# Patient Record
Sex: Female | Born: 1950 | Race: White | Hispanic: No | Marital: Married | Smoking: Never smoker
Health system: Southern US, Community
[De-identification: ages and names within clinical notes are randomized; demographics above are authoritative.]

## PROBLEM LIST (undated history)

## (undated) DIAGNOSIS — M2669 Other specified disorders of temporomandibular joint: Secondary | ICD-10-CM

## (undated) DIAGNOSIS — M7120 Synovial cyst of popliteal space [Baker], unspecified knee: Secondary | ICD-10-CM

## (undated) DIAGNOSIS — M810 Age-related osteoporosis without current pathological fracture: Secondary | ICD-10-CM

## (undated) DIAGNOSIS — E559 Vitamin D deficiency, unspecified: Secondary | ICD-10-CM

## (undated) DIAGNOSIS — M170 Bilateral primary osteoarthritis of knee: Secondary | ICD-10-CM

## (undated) DIAGNOSIS — R9389 Abnormal findings on diagnostic imaging of other specified body structures: Secondary | ICD-10-CM

## (undated) HISTORY — PX: TMJ ARTHROSCOPY: SHX1067

## (undated) HISTORY — DX: Vitamin D deficiency, unspecified: E55.9

## (undated) HISTORY — DX: Synovial cyst of popliteal space (Baker), unspecified knee: M71.20

## (undated) HISTORY — DX: Other specified disorders of temporomandibular joint: M26.69

## (undated) HISTORY — PX: BREAST BIOPSY: SHX20

## (undated) HISTORY — DX: Age-related osteoporosis without current pathological fracture: M81.0

## (undated) HISTORY — DX: Bilateral primary osteoarthritis of knee: M17.0

## (undated) HISTORY — DX: Abnormal findings on diagnostic imaging of other specified body structures: R93.89

## (undated) HISTORY — PX: COLONOSCOPY: SHX174

## (undated) HISTORY — PX: ABDOMINOPLASTY: SUR9

## (undated) HISTORY — PX: OTHER SURGICAL HISTORY: SHX169

---

## 1997-10-30 ENCOUNTER — Other Ambulatory Visit: Admission: RE | Admit: 1997-10-30 | Discharge: 1997-10-30 | Payer: Self-pay | Admitting: Obstetrics and Gynecology

## 1998-01-14 ENCOUNTER — Other Ambulatory Visit: Admission: RE | Admit: 1998-01-14 | Discharge: 1998-01-14 | Payer: Self-pay | Admitting: Radiology

## 1998-02-16 ENCOUNTER — Other Ambulatory Visit: Admission: RE | Admit: 1998-02-16 | Discharge: 1998-02-16 | Payer: Self-pay

## 1999-01-14 ENCOUNTER — Other Ambulatory Visit: Admission: RE | Admit: 1999-01-14 | Discharge: 1999-01-14 | Payer: Self-pay | Admitting: Obstetrics and Gynecology

## 2000-01-25 ENCOUNTER — Other Ambulatory Visit: Admission: RE | Admit: 2000-01-25 | Discharge: 2000-01-25 | Payer: Self-pay | Admitting: Obstetrics and Gynecology

## 2001-06-18 ENCOUNTER — Other Ambulatory Visit: Admission: RE | Admit: 2001-06-18 | Discharge: 2001-06-18 | Payer: Self-pay | Admitting: Obstetrics and Gynecology

## 2001-10-10 ENCOUNTER — Emergency Department (HOSPITAL_COMMUNITY): Admission: EM | Admit: 2001-10-10 | Discharge: 2001-10-10 | Payer: Self-pay | Admitting: *Deleted

## 2001-10-10 ENCOUNTER — Encounter: Payer: Self-pay | Admitting: *Deleted

## 2002-08-05 ENCOUNTER — Other Ambulatory Visit: Admission: RE | Admit: 2002-08-05 | Discharge: 2002-08-05 | Payer: Self-pay | Admitting: Obstetrics and Gynecology

## 2006-10-10 ENCOUNTER — Ambulatory Visit: Payer: Self-pay | Admitting: Sports Medicine

## 2006-10-10 DIAGNOSIS — M6750 Plica syndrome, unspecified knee: Secondary | ICD-10-CM | POA: Insufficient documentation

## 2006-10-10 DIAGNOSIS — M7120 Synovial cyst of popliteal space [Baker], unspecified knee: Secondary | ICD-10-CM

## 2006-10-10 HISTORY — DX: Synovial cyst of popliteal space (Baker), unspecified knee: M71.20

## 2006-12-01 ENCOUNTER — Ambulatory Visit: Payer: Self-pay | Admitting: Sports Medicine

## 2007-02-15 ENCOUNTER — Emergency Department (HOSPITAL_COMMUNITY): Admission: EM | Admit: 2007-02-15 | Discharge: 2007-02-15 | Payer: Self-pay | Admitting: Emergency Medicine

## 2010-10-18 ENCOUNTER — Encounter: Payer: Self-pay | Admitting: Internal Medicine

## 2010-11-15 ENCOUNTER — Encounter: Payer: Self-pay | Admitting: Internal Medicine

## 2010-11-15 ENCOUNTER — Ambulatory Visit (AMBULATORY_SURGERY_CENTER): Payer: BC Managed Care – PPO | Admitting: *Deleted

## 2010-11-15 VITALS — Ht 61.5 in | Wt 136.0 lb

## 2010-11-15 DIAGNOSIS — Z1211 Encounter for screening for malignant neoplasm of colon: Secondary | ICD-10-CM

## 2010-11-15 MED ORDER — PEG-KCL-NACL-NASULF-NA ASC-C 100 G PO SOLR: ORAL | Status: DC

## 2010-11-25 ENCOUNTER — Other Ambulatory Visit: Payer: Self-pay | Admitting: Internal Medicine

## 2010-11-26 ENCOUNTER — Ambulatory Visit (AMBULATORY_SURGERY_CENTER): Payer: BC Managed Care – PPO | Admitting: Internal Medicine

## 2010-11-26 ENCOUNTER — Encounter: Payer: Self-pay | Admitting: Internal Medicine

## 2010-11-26 VITALS — BP 121/71 | HR 76 | Temp 97.6°F | Resp 20

## 2010-11-26 DIAGNOSIS — Z1211 Encounter for screening for malignant neoplasm of colon: Secondary | ICD-10-CM

## 2010-11-26 MED ORDER — SODIUM CHLORIDE 0.9 % IV SOLN: 500.0000 mL | INTRAVENOUS | Status: DC

## 2010-11-26 NOTE — Patient Instructions (Signed)
PLEASE FOLLOW DISCHARGE INSTRUCTIONS GIVEN TODAY. RESUME CURRENT MEDICATIONS TODAY. DIVERTICULOSIS AND INTERNAL HEMORRHOIDS SEEN TODAY, WE RECOMMEND HIGH FIBER DIET. SEE HANDOUTS GIVEN. CALL us WITH ANY QUESTIONS OR CONCERNS. THANK YOU!

## 2010-11-29 ENCOUNTER — Telehealth: Payer: Self-pay

## 2010-11-29 NOTE — Telephone Encounter (Signed)
Left message

## 2010-12-03 LAB — DIFFERENTIAL
Eosinophils Absolute: 0.2
Lymphocytes Relative: 20
Lymphs Abs: 1.9
Monocytes Relative: 7
Neutro Abs: 6.9
Neutrophils Relative %: 70

## 2010-12-03 LAB — I-STAT 8, (EC8 V) (CONVERTED LAB)
Chloride: 107
HCT: 43
Hemoglobin: 14.6
Operator id: 198171
Potassium: 4.7
Sodium: 139

## 2010-12-03 LAB — POCT I-STAT CREATININE: Creatinine, Ser: 0.9

## 2010-12-03 LAB — CBC
MCV: 93.9
RBC: 4.27
WBC: 9.8

## 2013-02-28 HISTORY — PX: KNEE ARTHROSCOPY: SUR90

## 2013-04-02 ENCOUNTER — Ambulatory Visit
Admission: RE | Admit: 2013-04-02 | Discharge: 2013-04-02 | Disposition: A | Discharge status: Home / Self Care | Payer: Medicare Other | Source: Ambulatory Visit | Attending: Chiropractic Medicine | Admitting: Chiropractic Medicine

## 2013-04-02 ENCOUNTER — Other Ambulatory Visit: Payer: Self-pay | Admitting: Chiropractic Medicine

## 2013-04-02 DIAGNOSIS — R0789 Other chest pain: Secondary | ICD-10-CM

## 2013-04-02 DIAGNOSIS — M542 Cervicalgia: Secondary | ICD-10-CM

## 2013-04-02 DIAGNOSIS — M549 Dorsalgia, unspecified: Secondary | ICD-10-CM

## 2013-04-02 DIAGNOSIS — R0781 Pleurodynia: Secondary | ICD-10-CM

## 2013-04-15 ENCOUNTER — Institutional Professional Consult (permissible substitution): Payer: BC Managed Care – PPO | Admitting: Pulmonary Disease

## 2013-04-22 ENCOUNTER — Ambulatory Visit (INDEPENDENT_AMBULATORY_CARE_PROVIDER_SITE_OTHER): Payer: Medicare Other | Admitting: Pulmonary Disease

## 2013-04-22 ENCOUNTER — Encounter: Payer: Self-pay | Admitting: Pulmonary Disease

## 2013-04-22 VITALS — BP 124/76 | HR 75 | Ht 61.5 in | Wt 137.0 lb

## 2013-04-22 DIAGNOSIS — R9389 Abnormal findings on diagnostic imaging of other specified body structures: Secondary | ICD-10-CM

## 2013-04-22 DIAGNOSIS — R0789 Other chest pain: Secondary | ICD-10-CM

## 2013-04-22 DIAGNOSIS — R918 Other nonspecific abnormal finding of lung field: Secondary | ICD-10-CM

## 2013-04-22 HISTORY — DX: Abnormal findings on diagnostic imaging of other specified body structures: R93.89

## 2013-04-22 NOTE — Assessment & Plan Note (Signed)
I explained to Ms. Brooke Lee today that I think that the very mild abnormal finding on her chest x-ray today was due to scarring from the lung infection she had two years ago while traveling in [place].  Considering her lack of dyspnea or cough, it is not a worrisome finding, but I would like to see a CXR from a few years ago to compare.  If she can't produce that, then I will see her back in 6 months with a repeat CXR.  Plan: -bring CXR from 2 years ago -if she can't find that, then repeat CXR in 6 months

## 2013-04-22 NOTE — Progress Notes (Signed)
Subjective:    Patient ID: Brooke Lee, female    DOB: [birth year], 63 y.o.   MRN: [id number]  HPI  Bonita QuinLinda is here to see me because she is "having difficulty in her chest area".  She has been having pain when she takes a deep breath which is really bad when she sneezes.  She had bronchitis 2 years ago for the first time in her lie.  She was traveling in [place] and [place] for this.  She also had a UTI at the time.  She wsa treated with three different antibiotics during that time.  Since then she hasn't had much improvement in chest tightness and pain with a deep breath.  In September she was traveling and she feels like her "rib cage was a steel cage that wouldn't move".  The tightness and pain occurs with a deep breath. She feels stiffness and soreness, but can't really characterize it as sharp or dull.  This has improved with treatment with he rchiropracter over the lat few years.    She has not had a cough in the last year.  She has not been sick since then.  She has never had any lung surgeries.  As a child she had pneumonia, when she was [age] years old.   She doesn't feel short of breath with regular activities despite the pain in her chest.  She just feels   Past Medical History  Diagnosis Date  . TMJ locking      Family History  Problem Relation Age of Onset  . Heart disease Mother   . Heart disease Father      History   Social History  . Marital Status: Married    Spouse Name: N/A    Number of Children: N/A  . Years of Education: N/A   Occupational History  . Not on file.   Social History Main Topics  . Smoking status: Never Smoker   . Smokeless tobacco: Never Used  . Alcohol Use: 2.4 oz/week    4 Glasses of wine per week  . Drug Use: No  . Sexual Activity: Not on file   Other Topics Concern  . Not on file   Social History Narrative  . No narrative on file     No Known Allergies   Outpatient Prescriptions Prior to Visit  Medication Sig  Dispense Refill  . Cholecalciferol (VITAMIN D3) 5000 UNITS TABS Take 1 tablet by mouth daily.        Marland Kitchen. glucosamine-chondroitin 500-400 MG tablet Take 2 tablets by mouth daily.        . Multiple Vitamins-Minerals (MULTIVITAMIN WITH MINERALS) tablet Take 1 tablet by mouth daily.        . fish oil-omega-3 fatty acids 1000 MG capsule Take 1 g by mouth 2 (two) times daily.         No facility-administered medications prior to visit.      Review of Systems  Constitutional: Negative for fever and unexpected weight change.  HENT: Positive for sneezing. Negative for congestion, dental problem, ear pain, nosebleeds, postnasal drip, rhinorrhea, sinus pressure, sore throat and trouble swallowing.   Eyes: Negative for redness and itching.  Respiratory: Positive for cough. Negative for chest tightness, shortness of breath and wheezing.   Cardiovascular: Negative for palpitations and leg swelling.  Gastrointestinal: Negative for nausea and vomiting.  Genitourinary: Negative for dysuria.  Musculoskeletal: Negative for joint swelling.  Skin: Negative for rash.  Neurological: Negative for headaches.  Hematological:  Does not bruise/bleed easily.  Psychiatric/Behavioral: Negative for dysphoric mood. The patient is not nervous/anxious.        Objective:   Physical Exam Filed Vitals:   04/22/13 1024  BP: 124/76  Pulse: 75  Height: 5' 1.5" (1.562 m)  Weight: 137 lb (62.143 kg)  SpO2: 98%   RA  Gen: well appearing, no acute distress HEENT: NCAT, PERRL, EOMi, OP clear, neck supple without masses PULM: CTA B CV: RRR, no mgr, no JVD AB: BS+, soft, nontender, no hsm Ext: warm, no edema, no clubbing, no cyanosis Derm: no rash or skin breakdown Neuro: A&Ox4, CN II-XII intact, strength 5/5 in all 4 extremities  03/2013 CXR images reviewed by me> very mild atelectasis in the left lower lobe      Assessment & Plan:   Abnormal CXR I explained to Ms. Mosco today that I think that the very mild  abnormal finding on her chest x-ray today was due to scarring from the lung infection she had two years ago while traveling in [place].  Considering her lack of dyspnea or cough, it is not a worrisome finding, but I would like to see a CXR from a few years ago to compare.  If she can't produce that, then I will see her back in 6 months with a repeat CXR.  Plan: -bring CXR from 2 years ago -if she can't find that, then repeat CXR in 6 months  Chest tightness Given that she is improving with chiropractic care I think this is a musculoskeletal issue and not due to pulmonary parenchymal problems.  However, we will get pulmonary function testing to assess further.   Updated Medication List Outpatient Encounter Prescriptions as of 04/22/2013  Medication Sig  . Cholecalciferol (VITAMIN D3) 5000 UNITS TABS Take 1 tablet by mouth daily.    Marland Kitchen glucosamine-chondroitin 500-400 MG tablet Take 2 tablets by mouth daily.    . Multiple Vitamins-Minerals (MULTIVITAMIN WITH MINERALS) tablet Take 1 tablet by mouth daily.    . fish oil-omega-3 fatty acids 1000 MG capsule Take 1 g by mouth 2 (two) times daily.

## 2013-04-22 NOTE — Patient Instructions (Signed)
Please track down the Chest X-ray from two years ago and bring it by our office. We will see you back in 6 months with a Chest X-ray if we can't get that study If you develop shortness of breath please come back to see us

## 2013-04-22 NOTE — Assessment & Plan Note (Signed)
Given that she is improving with chiropractic care I think this is a musculoskeletal issue and not due to pulmonary parenchymal problems.  However, we will get pulmonary function testing to assess further.

## 2013-05-06 ENCOUNTER — Ambulatory Visit: Payer: Medicare Other | Admitting: Internal Medicine

## 2013-06-03 ENCOUNTER — Telehealth: Payer: Self-pay

## 2013-06-03 NOTE — Telephone Encounter (Signed)
lmtcb X1 to make pt aware of films.  Per her last visit with BQ she needs to be seen in 6 mos with a cxr at that visit.  A recall has already been ordered.

## 2013-06-03 NOTE — Telephone Encounter (Signed)
Message copied by Velvet BatheAULFIELD, Lloyde Ludlam L on Mon Jun 03, 2013  2:26 PM ------      Message from: Max FickleMCQUAID, DOUGLAS B      Created: Thu May 30, 2013 10:35 PM       A,            Please let her know that I looked at all the films from the disc she brought in.  Thank her for bringing it, but let her know that unfortunately there wasn't a real chest x-ray for me to see (just spine films and none with a complete chest image).            Thanks      B ------

## 2013-06-04 NOTE — Telephone Encounter (Signed)
Pt returned call @[facility]  office.  LMTCB X2

## 2013-06-05 NOTE — Telephone Encounter (Signed)
Pt came in to [facility] office yesterday to review results.  She had her CBC drawn yesterday.  She is aware of results and recs.  Nothing further needed at this time.

## 2013-06-14 ENCOUNTER — Telehealth: Payer: Self-pay | Admitting: Pulmonary Disease

## 2013-06-14 NOTE — Telephone Encounter (Signed)
Velvet BatheAshley L Caulfield, CMA at 06/04/2013 12:29 PM    Status: Signed       Pt returned call @[facility]  office. LMTCB X2       Velvet Batheshley L Caulfield, CMA at 06/03/2013 2:26 PM     Status: Signed        lmtcb X1 to make pt aware of films. Per her last visit with BQ she needs to be seen in 6 mos with a cxr at that visit. A recall has already been ordered.        Velvet BatheAshley L Caulfield, CMA at 06/03/2013 2:26 PM     Status: Signed        Message copied by Velvet BatheAULFIELD, ASHLEY L on Mon Jun 03, 2013 2:26 PM  ------  Message from: Max FickleMCQUAID, DOUGLAS B  Created: Thu May 30, 2013 10:35 PM  A,  Please let her know that I looked at all the films from the disc she brought in. Thank her for bringing it, but let her know that unfortunately there wasn't a real chest x-ray for me to see (just spine films and none with a complete chest image).  Thanks  B  ---------------------------------------  Spoke with pt regarding this.  She has a recall in her chart already, will need a cxr before her visit per BQ's last notes.  She is aware of this. Nothing further needed at this time.

## 2013-08-29 ENCOUNTER — Ambulatory Visit: Payer: BC Managed Care – PPO | Admitting: Internal Medicine

## 2013-10-03 ENCOUNTER — Ambulatory Visit (INDEPENDENT_AMBULATORY_CARE_PROVIDER_SITE_OTHER): Payer: Medicare Other | Admitting: Family Medicine

## 2013-10-03 ENCOUNTER — Encounter: Payer: Self-pay | Admitting: Family Medicine

## 2013-10-03 VITALS — BP 113/78 | HR 70 | Temp 99.3°F | Resp 18 | Ht 61.0 in | Wt 131.0 lb

## 2013-10-03 DIAGNOSIS — R5383 Other fatigue: Principal | ICD-10-CM

## 2013-10-03 DIAGNOSIS — Z0000 Encounter for general adult medical examination without abnormal findings: Secondary | ICD-10-CM

## 2013-10-03 DIAGNOSIS — R5381 Other malaise: Secondary | ICD-10-CM

## 2013-10-03 DIAGNOSIS — IMO0001 Reserved for inherently not codable concepts without codable children: Secondary | ICD-10-CM

## 2013-10-03 LAB — LIPID PANEL
CHOL/HDL RATIO: 4
Cholesterol: 210 mg/dL — ABNORMAL HIGH (ref 0–200)
HDL: 51.9 mg/dL (ref >39.00)
LDL CALC: 136 mg/dL — AB (ref 0–99)
NonHDL: 158.1
TRIGLYCERIDES: 112 mg/dL (ref 0.0–149.0)
VLDL: 22.4 mg/dL (ref 0.0–40.0)

## 2013-10-03 LAB — CBC WITH DIFFERENTIAL/PLATELET
BASOS PCT: 0.1 % (ref 0.0–3.0)
Basophils Absolute: 0 10*3/uL (ref 0.0–0.1)
EOS ABS: 0.2 10*3/uL (ref 0.0–0.7)
EOS PCT: 2.1 % (ref 0.0–5.0)
HCT: 40.3 % (ref 36.0–46.0)
Hemoglobin: 13.1 g/dL (ref 12.0–15.0)
LYMPHS PCT: 31 % (ref 12.0–46.0)
Lymphs Abs: 2.8 10*3/uL (ref 0.7–4.0)
MCHC: 32.6 g/dL (ref 30.0–36.0)
MCV: 90.3 fl (ref 78.0–100.0)
Monocytes Absolute: 0.6 10*3/uL (ref 0.1–1.0)
Monocytes Relative: 7.1 % (ref 3.0–12.0)
Neutro Abs: 5.4 10*3/uL (ref 1.4–7.7)
Neutrophils Relative %: 59.7 % (ref 43.0–77.0)
Platelets: 351 10*3/uL (ref 150.0–400.0)
RBC: 4.46 Mil/uL (ref 3.87–5.11)
RDW: 13.9 % (ref 11.5–15.5)
WBC: 9 10*3/uL (ref 4.0–10.5)

## 2013-10-03 LAB — COMPREHENSIVE METABOLIC PANEL
ALK PHOS: 91 U/L (ref 39–117)
ALT: 13 U/L (ref 0–35)
AST: 17 U/L (ref 0–37)
Albumin: 4.2 g/dL (ref 3.5–5.2)
BILIRUBIN TOTAL: 0.6 mg/dL (ref 0.2–1.2)
BUN: 18 mg/dL (ref 6–23)
CO2: 26 mEq/L (ref 19–32)
Calcium: 9.9 mg/dL (ref 8.4–10.5)
Chloride: 99 mEq/L (ref 96–112)
Creatinine, Ser: 1 mg/dL (ref 0.4–1.2)
GFR: 60.43 mL/min (ref >60.00)
GLUCOSE: 81 mg/dL (ref 70–99)
Potassium: 4.8 mEq/L (ref 3.5–5.1)
Sodium: 134 mEq/L — ABNORMAL LOW (ref 135–145)
Total Protein: 7.2 g/dL (ref 6.0–8.3)

## 2013-10-03 LAB — TSH: TSH: 1.52 u[IU]/mL (ref 0.35–4.50)

## 2013-10-03 LAB — CK: CK TOTAL: 48 U/L (ref 7–177)

## 2013-10-03 NOTE — Progress Notes (Signed)
Pre visit review using our clinic review tool, if applicable. No additional management support is needed unless otherwise documented below in the visit note. 

## 2013-10-03 NOTE — Progress Notes (Signed)
Office Note 10/03/2013  CC:  Chief Complaint  Patient presents with  . Establish Care  . Muscle Pain    muscle tissue is hard, neck, shoulder, and leg   HPI:  Brooke Lee is a 63 y.o. White female who is here to establish care. Patient's most recent primary MD: none Old records were not reviewed prior to or during today's visit.  Most recent Tetanus 1998. Pneumovax 11/2012. Mammogram is due and is scheduled per pt.  Formerly saw [facility] for this but wants to switch all care here.  Complains of subjective feeling of decreased muscle strength muscle aches x > 1 yr.  We didn't get into this further today: put it off for separate appt in order to focus on it appropriately.  Past Medical History  Diagnosis Date  . 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 Abnormal CXR 04/22/2013    03/2013 CXR > very mild atelectasis vs scarring left lung .  Hx of pneumonia twice as a child.  Marland Kitchen BAKER'S CYST, RIGHT KNEE 10/10/2006    Qualifier: Diagnosis of  By: Oneida Alar MD, KARL      Past Surgical History  Procedure Laterality Date  . Tmj arthroscopy  remote past  . Abdominoplasty  remote past  . Breast biopsy  multiple    benign;   . Colonoscopy  approx 2011    normal; recall 10 yrs    Family History  Problem Relation Age of Onset  . Heart disease Mother   . Hypothyroidism Mother   . Heart disease Father   . Arthritis Father   . Hyperlipidemia Father   . Diabetes Father     History   Social History  . Marital Status: Married    Spouse Name: N/A    Number of Children: N/A  . Years of Education: N/A   Occupational History  . Not on file.   Social History Main Topics  . Smoking status: Never Smoker   . Smokeless tobacco: Never Used  . Alcohol Use: 1.8 - 3.0 oz/week    3-5 Glasses of wine per week     Comment: weekly  . Drug Use: No  . Sexual Activity: Not on file   Other Topics Concern  . Not on file   Social History Narrative   Married, two children, one  grandchild.   Retired: Former Armed forces operational officer, Games developer.   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   NO smoking.   Alc: avg 1 glass wine per night.   Exercise: wt training 3X per week, walking, yoga.             MEDS: none  Allergies  Allergen Reactions  . Benadryl [Diphenhydramine Hcl]     Heart races    ROS Review of Systems  Constitutional: Negative for fever and unexpected weight change.  HENT: Negative for congestion and sore throat.   Eyes: Negative for visual disturbance.  Respiratory: Negative for cough.   Cardiovascular: Negative for chest pain.  Gastrointestinal: Negative for nausea and abdominal pain.  Genitourinary: Negative for dysuria.  Musculoskeletal: Negative for back pain and joint swelling.  Skin: Negative for rash.  Neurological: Negative for tremors and headaches.  Hematological: Negative for adenopathy.    PE; Blood pressure 113/78, pulse 70, temperature 99.3 F (37.4 C), temperature source Temporal, resp. rate 18, height 5' 1" (1.549 m), weight 131 lb (59.421 kg), SpO2 99.00%. Gen: Alert, well appearing.  Patient  is oriented to person, place, time, and situation. YQM:VHQI: no injection, icteris, swelling, or exudate.  EOMI, PERRLA. Mouth: lips without lesion/swelling.  Oral mucosa pink and moist. Oropharynx without erythema, exudate, or swelling.  Neck - No masses or thyromegaly or limitation in range of motion CV: RRR, no m/r/g.   LUNGS: CTA bilat, nonlabored resps, good aeration in all lung fields. EXT: no clubbing, cyanosis, or edema.   Pertinent labs:  none  ASSESSMENT AND PLAN:   1) New pt, establishing care, obtain old records from her GYN office.  2) Muscle pain and subjective generalized weakness.  Pt requested that screening health panel labs be done today since she is fasting, so I did CBC, CMET, TSH, and FLP. Will also do CK total and ESR given her muscle complaints. She will return in  about a week to go over her muscle complaints in detail, review labs.  An After Visit Summary was printed and given to the patient.  Return for f/u with 30 min visit to discuss muscle issues in 1 week or later.Marland Kitchen

## 2013-10-04 LAB — SEDIMENTATION RATE: Sed Rate: 22 mm/hr (ref 0–22)

## 2013-10-09 ENCOUNTER — Encounter: Payer: Self-pay | Admitting: Family Medicine

## 2013-10-09 ENCOUNTER — Ambulatory Visit (INDEPENDENT_AMBULATORY_CARE_PROVIDER_SITE_OTHER): Payer: Medicare Other | Admitting: Family Medicine

## 2013-10-09 VITALS — BP 122/79 | HR 82 | Temp 99.0°F | Resp 16 | Ht 61.0 in | Wt 131.0 lb

## 2013-10-09 DIAGNOSIS — M7580 Other shoulder lesions, unspecified shoulder: Secondary | ICD-10-CM

## 2013-10-09 DIAGNOSIS — M67919 Unspecified disorder of synovium and tendon, unspecified shoulder: Secondary | ICD-10-CM

## 2013-10-09 DIAGNOSIS — M25519 Pain in unspecified shoulder: Secondary | ICD-10-CM

## 2013-10-09 DIAGNOSIS — M719 Bursopathy, unspecified: Secondary | ICD-10-CM

## 2013-10-09 NOTE — Progress Notes (Signed)
Pre visit review using our clinic review tool, if applicable. No additional management support is needed unless otherwise documented below in the visit note. 

## 2013-10-09 NOTE — Progress Notes (Signed)
OFFICE VISIT  10/11/2013   CC:  Chief Complaint  Patient presents with  . muscle problems  . Results   HPI:    Patient is a 63 y.o. Caucasian female who presents for discussion of muscle complaints.   Started having problems about 2 yrs ago with the feeling of bilat leg weakness, trouble standing up. Once she got up she felt fine and was able to walk fine.  Over time, months/year, this weakness resolved and she feels like this is all gone now. Tops of shoulders hurt "like hell", extends up into neck diffusely, feels somewhat weak in shoulders/arms--this is the lasting, prominent muscle issue she refers to.  No tingling or numbness in arms or hands.  No weakness in UEs. Intermittent periods of difficulty taking a deep breath and some musculoskeletal-type chest pain.  Again she mentions this to be thorough in her history but says this has all resolved for some time now. Today she doesn't have any of this chest feeling.    Doing massage therapy and acupuncture for shoulders and neck.    ROS: no rash.  No joint pain or swelling.  No tingling or numbness in arms or legs.  Past Medical History  Diagnosis Date  . 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 Abnormal CXR 04/22/2013    03/2013 CXR > very mild atelectasis vs scarring left lung .  Hx of pneumonia twice as a child.  Marland Kitchen. BAKER'S CYST, RIGHT KNEE 10/10/2006    Qualifier: Diagnosis of  By: Darrick PennaFIELDS MD, KARL      Past Surgical History  Procedure Laterality Date  . Tmj arthroscopy  remote past  . Abdominoplasty  remote past  . Breast biopsy  multiple    benign;   . Colonoscopy  approx 2011    normal; recall 10 yrs   MEDS: none  Allergies  Allergen Reactions  . Benadryl [Diphenhydramine Hcl]     Heart races   ROS As per HPI  PE: Blood pressure 122/79, pulse 82, temperature 99 F (37.2 C), temperature source Temporal, resp. rate 16, height 5\' 1"  (1.549 m), weight 131 lb (59.421 kg), SpO2 97.00%. Gen: Alert, well appearing.   Patient is oriented to person, place, time, and situation. LKG:MWNUENT:Eyes: no injection, icteris, swelling, or exudate.  EOMI, PERRLA. Mouth: lips without lesion/swelling.  Oral mucosa pink and moist. Oropharynx without erythema, exudate, or swelling.  Neck: mildly diminished ROM with mild stiffness.  No tenderness. No tenderness over trapezius mm's.  She has discrete TTP over both AC joints and over both acromion hooks anterolaterally. Mild pain with abduction, particularly at the 110 deg mark, neg drop sign.  +Empty can sign.  No shoulder instability. No TTP of deltoids.  UE strength 5/5 prox and dist bilat.  Spurling's neg. UE DTRs 2+ in triceps, biceps, and brachioradialis areas bilat. No LE tenderness or weakness.   CV: RRR, no m/r/g.   LUNGS: CTA bilat, nonlabored resps, good aeration in all lung fields.   LABS:  Lab Results  Component Value Date   TSH 1.52 10/03/2013   Lab Results  Component Value Date   WBC 9.0 10/03/2013   HGB 13.1 10/03/2013   HCT 40.3 10/03/2013   MCV 90.3 10/03/2013   PLT 351.0 10/03/2013   Lab Results  Component Value Date   CREATININE 1.0 10/03/2013   BUN 18 10/03/2013   NA 134* 10/03/2013   K 4.8 10/03/2013   CL 99 10/03/2013   CO2 26 10/03/2013  Lab Results  Component Value Date   ALT 13 10/03/2013   AST 17 10/03/2013   ALKPHOS 91 10/03/2013   BILITOT 0.6 10/03/2013   Lab Results  Component Value Date   CHOL 210* 10/03/2013   Lab Results  Component Value Date   HDL 51.90 10/03/2013   Lab Results  Component Value Date   LDLCALC 136* 10/03/2013   Lab Results  Component Value Date   TRIG 112.0 10/03/2013   Lab Results  Component Value Date   CHOLHDL 4 10/03/2013   No results found for this basename: PSA   Lab Results  Component Value Date   ESRSEDRATE 22 10/03/2013   CK total on 10/03/13 = 48 (normal)   IMPRESSION AND PLAN:  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 She really doesn't have any distinct myalgia/muscle issues. Discussed treatment  options, decided to get her into physical therapy with [facility]. She already uses NSAIDs in reasonable quantities pretty regularly, so we will make no change there.  An After Visit Summary was printed and given to the patient.  FOLLOW UP: Return if symptoms worsen or fail to improve.

## 2013-10-11 ENCOUNTER — Ambulatory Visit (INDEPENDENT_AMBULATORY_CARE_PROVIDER_SITE_OTHER): Payer: Medicare Other | Admitting: Nurse Practitioner

## 2013-10-11 ENCOUNTER — Encounter: Payer: Self-pay | Admitting: Nurse Practitioner

## 2013-10-11 VITALS — BP 125/80 | HR 73 | Temp 98.4°F | Ht 61.0 in

## 2013-10-11 DIAGNOSIS — M25469 Effusion, unspecified knee: Secondary | ICD-10-CM

## 2013-10-11 DIAGNOSIS — M25461 Effusion, right knee: Secondary | ICD-10-CM

## 2013-10-11 MED ORDER — DICLOFENAC SODIUM 1 % TD GEL: TRANSDERMAL | Status: DC

## 2013-10-11 NOTE — Patient Instructions (Signed)
You have arthritis in knee, evidenced by Baker's cyst, but likely damaged soft tissue when you fell yesterday. Use crutches. Wear knee sleeve. Ice 10  Minutes 3 times daily. Apply voltaren gel or use ibuprophen. Do not use both. See orthopedist today. Appointment is at [facility] at 2 pm.  Knee Effusion The medical term for having fluid in your knee is effusion. This is often due to an internal derangement of the knee. This means something is wrong inside the knee. Some of the causes of fluid in the knee may be torn cartilage, a torn ligament, or bleeding into the joint from an injury. Your knee is likely more difficult to bend and move. This is often because there is increased pain and pressure in the joint. The time it takes for recovery from a knee effusion depends on different factors, including:   Type of injury.  Your age.  Physical and medical conditions.  Rehabilitation Strategies. How long you will be away from your normal activities will depend on what kind of knee problem you have and how much damage is present. Your knee has two types of cartilage. Articular cartilage covers the bone ends and lets your knee bend and move smoothly. Two menisci, thick pads of cartilage that form a rim inside the joint, help absorb shock and stabilize your knee. Ligaments bind the bones together and support your knee joint. Muscles move the joint, help support your knee, and take stress off the joint itself. CAUSES  Often an effusion in the knee is caused by an injury to one of the menisci. This is often a tear in the cartilage. Recovery after a meniscus injury depends on how much meniscus is damaged and whether you have damaged other knee tissue. Small tears may heal on their own with conservative treatment. Conservative means rest, limited weight bearing activity and muscle strengthening exercises. Your recovery may take up to 6 weeks.  TREATMENT  Larger tears may require surgery. Meniscus injuries may  be treated during arthroscopy. Arthroscopy is a procedure in which your surgeon uses a small telescope like instrument to look in your knee. Your caregiver can make a more accurate diagnosis (learning what is wrong) by performing an arthroscopic procedure. If your injury is on the inner margin of the meniscus, your surgeon may trim the meniscus back to a smooth rim. In other cases your surgeon will try to repair a damaged meniscus with stitches (sutures). This may make rehabilitation take longer, but may provide better long term result by helping your knee keep its shock absorption capabilities. Ligaments which are completely torn usually require surgery for repair. HOME CARE INSTRUCTIONS  Use crutches as instructed.  If a brace is applied, use as directed.  Once you are home, an ice pack applied to your swollen knee may help with discomfort and help decrease swelling.  Keep your knee raised (elevated) when you are not up and around or on crutches.  Only take over-the-counter or prescription medicines for pain, discomfort, or fever as directed by your caregiver.  Your caregivers will help with instructions for rehabilitation of your knee. This often includes strengthening exercises.  You may resume a normal diet and activities as directed. SEEK MEDICAL CARE IF:   There is increased swelling in your knee.  You notice redness, swelling, or increasing pain in your knee.  An unexplained oral temperature above 102 F (38.9 C) develops. SEEK IMMEDIATE MEDICAL CARE IF:   You develop a rash.  You have difficulty breathing.  You have any allergic reactions from medications you may have been given.  There is severe pain with any motion of the knee. MAKE SURE YOU:   Understand these instructions.  Will watch your condition.  Will get help right away if you are not doing well or get worse. Document Released: 05/07/2003 Document Revised: 05/09/2011 Document Reviewed:  07/11/2007 [facility] Patient Information 2015 [employer] [place]. This information is not intended to replace advice given to you by your health care provider. Make sure you discuss any questions you have with your health care provider.

## 2013-10-11 NOTE — Progress Notes (Signed)
Subjective:    Brooke Lee is a 63 y.o. female who presents with knee swelling involving the right knee. She is accompanied by her husband & arrives in wheelchair. She has had posterior knee swelling & pain w/ flexion & weight bearing for at least 1 week. Yesterday she was climbing stairs when knee "gave out". She did not hit the ground-was able to interrupt fall w/hand rail. Knee is painful to bear weight & "feels weak". Current symptoms include: giving out, pain located anterior below knee & medial, stiffness and swelling. Pain is aggravated by any weight bearing. Patient has had prior knee problems. Evaluation to date: none. Treatment to date: avoidance of offending activity, elastic supporter which is somewhat effective, ice, OTC analgesics which are somewhat effective and naturopathic remdy-botswelia leaf.  The following portions of the patient's history were reviewed and updated as appropriate: allergies, current medications, past medical history, past social history, past surgical history and problem list.   Review of Systems Pertinent items are noted in HPI.   Objective:    BP 125/80  Pulse 73  Temp(Src) 98.4 F (36.9 C) (Oral)  Ht 5\' 1"  (1.549 m)  SpO2 98% Right knee: positive exam findings: effusion, warmth and painful flexion No joint laxity. Mild effusion. Baker's cyst.  Left knee:  no effusion, full active range of motion, no joint line tenderness, ligamentous structures intact. and mild warmth, no effusion or pain     Assessment:  1. 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  - diclofenac sodium (VOLTAREN) 1 % GEL; Apply quarter-sized amount on front, back, & sides of knee TID  Dispense: 100 g; Refill: 1 Ice Knee sleeve Crutches-Proper fit & instructed on proper use. Ref Delbert HarnessMurphy Wainer. Pt has appt. At 2 pm today.

## 2013-10-11 NOTE — Progress Notes (Signed)
Pre visit review using our clinic review tool, if applicable. No additional management support is needed unless otherwise documented below in the visit note. 

## 2013-12-03 ENCOUNTER — Ambulatory Visit: Payer: Medicare Other | Admitting: Internal Medicine

## 2014-03-04 DIAGNOSIS — S83241A Other tear of medial meniscus, current injury, right knee, initial encounter: Secondary | ICD-10-CM | POA: Diagnosis not present

## 2014-03-06 ENCOUNTER — Ambulatory Visit: Payer: Medicare Other | Admitting: Internal Medicine

## 2014-03-06 DIAGNOSIS — S83241A Other tear of medial meniscus, current injury, right knee, initial encounter: Secondary | ICD-10-CM | POA: Diagnosis not present

## 2014-03-08 DIAGNOSIS — S83241A Other tear of medial meniscus, current injury, right knee, initial encounter: Secondary | ICD-10-CM | POA: Diagnosis not present

## 2014-03-11 ENCOUNTER — Telehealth: Payer: Self-pay | Admitting: Family Medicine

## 2014-03-11 ENCOUNTER — Other Ambulatory Visit: Payer: Self-pay | Admitting: Orthopedic Surgery

## 2014-03-11 DIAGNOSIS — M7121 Synovial cyst of popliteal space [Baker], right knee: Secondary | ICD-10-CM

## 2014-03-11 DIAGNOSIS — M1711 Unilateral primary osteoarthritis, right knee: Secondary | ICD-10-CM | POA: Diagnosis not present

## 2014-03-11 DIAGNOSIS — M25561 Pain in right knee: Secondary | ICD-10-CM | POA: Diagnosis not present

## 2014-03-11 DIAGNOSIS — S83241A Other tear of medial meniscus, current injury, right knee, initial encounter: Secondary | ICD-10-CM | POA: Diagnosis not present

## 2014-03-11 DIAGNOSIS — M25461 Effusion, right knee: Secondary | ICD-10-CM | POA: Diagnosis not present

## 2014-03-11 NOTE — Telephone Encounter (Signed)
Please advise 

## 2014-03-11 NOTE — Telephone Encounter (Signed)
Pt needs 15 min appt to go over this with me please.-thx

## 2014-03-11 NOTE — Telephone Encounter (Signed)
Pt called and stated she was going to [place] Jan 22. She is requesting a RX for antibiotics in the event she needs them. She also would like to know if there are any other meds she should take with her. Please call and advise. Dwaine Deter/DH

## 2014-03-12 ENCOUNTER — Ambulatory Visit (INDEPENDENT_AMBULATORY_CARE_PROVIDER_SITE_OTHER): Payer: Commercial Managed Care - HMO | Admitting: Family Medicine

## 2014-03-12 ENCOUNTER — Ambulatory Visit
Admission: RE | Admit: 2014-03-12 | Discharge: 2014-03-12 | Disposition: A | Discharge status: Home / Self Care | Payer: Self-pay | Source: Ambulatory Visit | Attending: Orthopedic Surgery | Admitting: Orthopedic Surgery

## 2014-03-12 DIAGNOSIS — M7121 Synovial cyst of popliteal space [Baker], right knee: Secondary | ICD-10-CM | POA: Diagnosis not present

## 2014-03-12 DIAGNOSIS — Z23 Encounter for immunization: Secondary | ICD-10-CM

## 2014-03-12 MED ORDER — CIPROFLOXACIN HCL 500 MG PO TABS: ORAL_TABLET | ORAL | Status: DC

## 2014-03-12 MED ORDER — TYPHOID VACCINE PO CPDR: DELAYED_RELEASE_CAPSULE | ORAL | Status: DC

## 2014-03-12 MED ORDER — ATOVAQUONE-PROGUANIL HCL 250-100 MG PO TABS: ORAL_TABLET | ORAL | Status: DC

## 2014-03-12 NOTE — Telephone Encounter (Signed)
Pt will be coming in for nurse visit for Tdap.

## 2014-03-14 NOTE — Telephone Encounter (Signed)
Pt had Tdap 03-12-2014

## 2014-03-18 ENCOUNTER — Inpatient Hospital Stay: Admission: RE | Admit: 2014-03-18 | Payer: Self-pay | Source: Ambulatory Visit

## 2014-04-22 DIAGNOSIS — H5210 Myopia, unspecified eye: Secondary | ICD-10-CM | POA: Diagnosis not present

## 2014-04-22 DIAGNOSIS — H5203 Hypermetropia, bilateral: Secondary | ICD-10-CM | POA: Diagnosis not present

## 2014-05-06 IMAGING — CR DG CERVICAL SPINE COMPLETE 4+V
5 series · 5 of 5 positions shown · non-contrast
Comparison: None.

CLINICAL DATA: Neck pain

EXAM:
CERVICAL SPINE  4+ VIEWS

[view not recorded (1 of 5)]
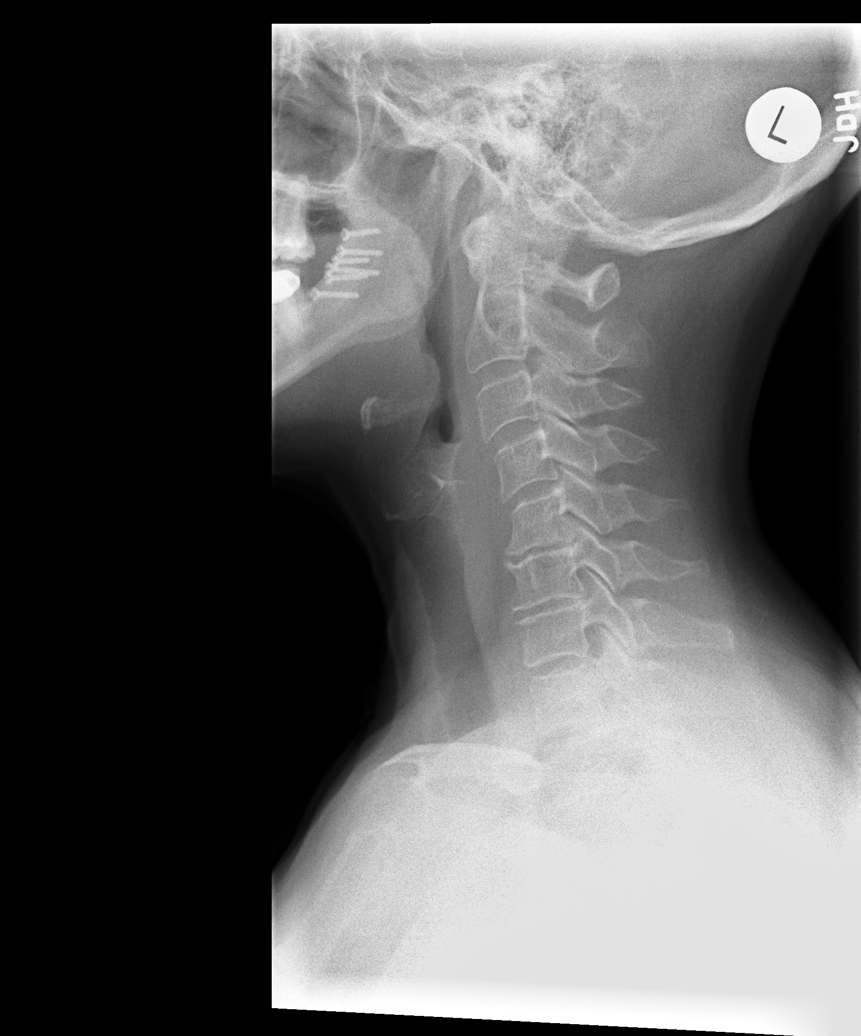

[view not recorded (2 of 5)]
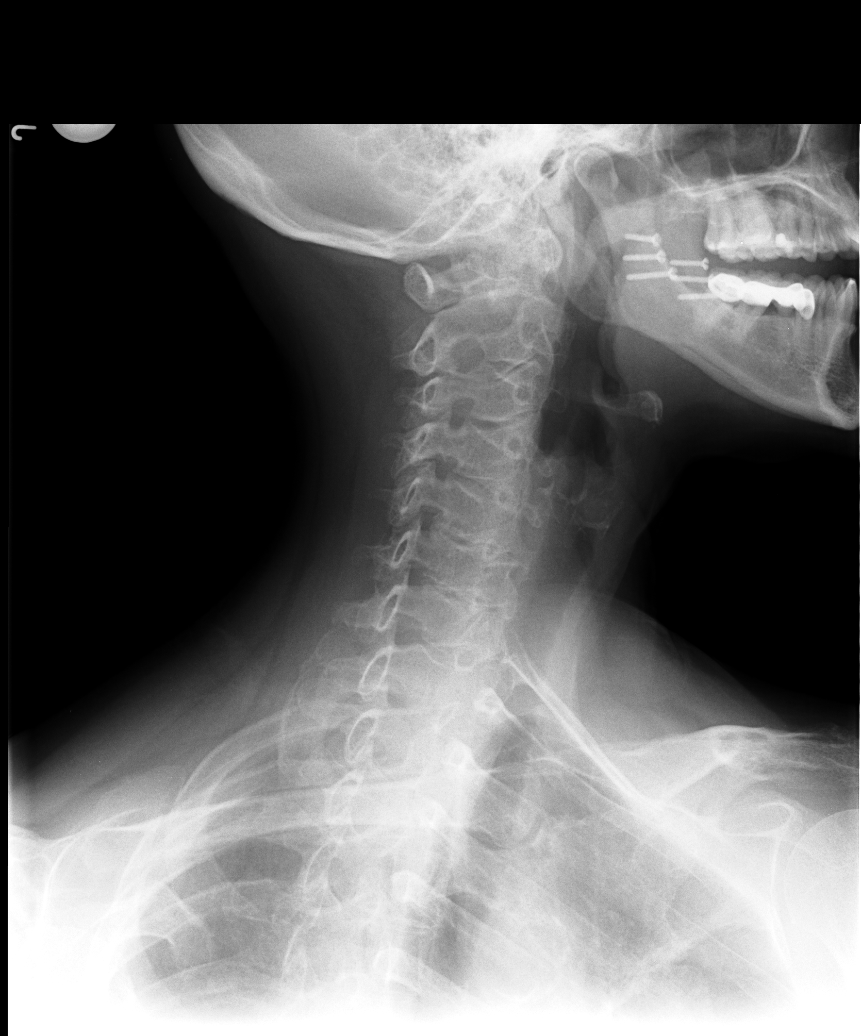

[view not recorded (3 of 5)]
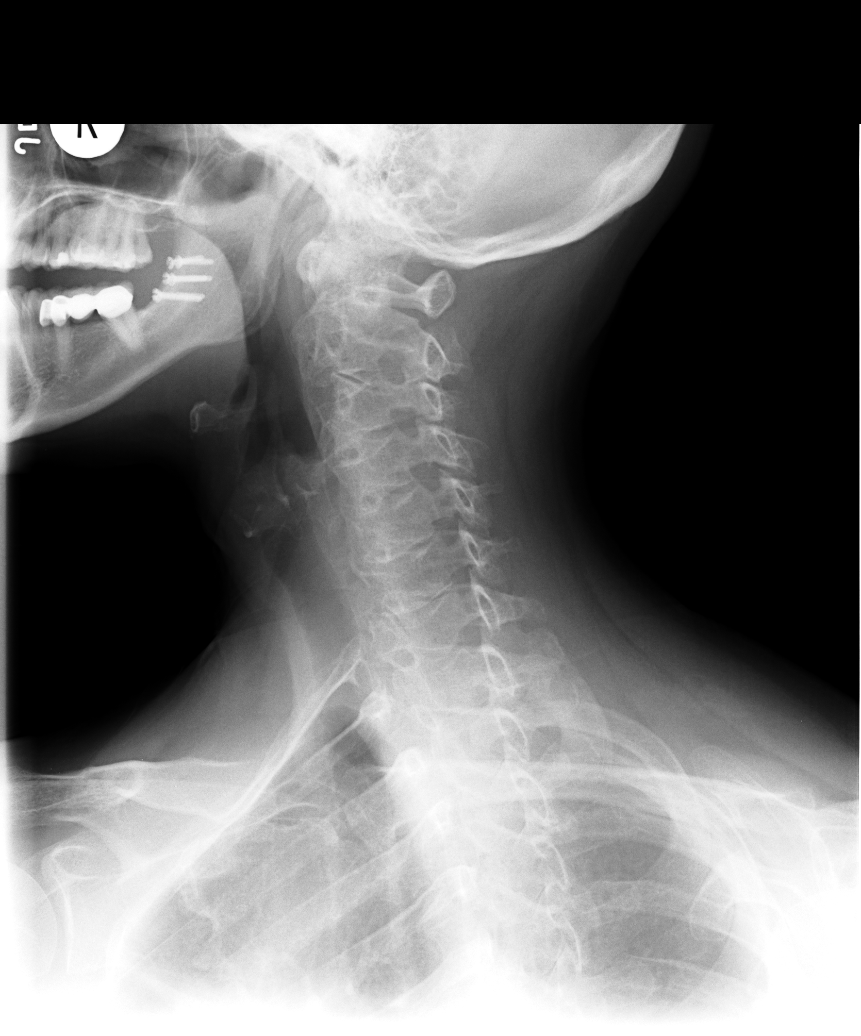

[view not recorded (4 of 5)]
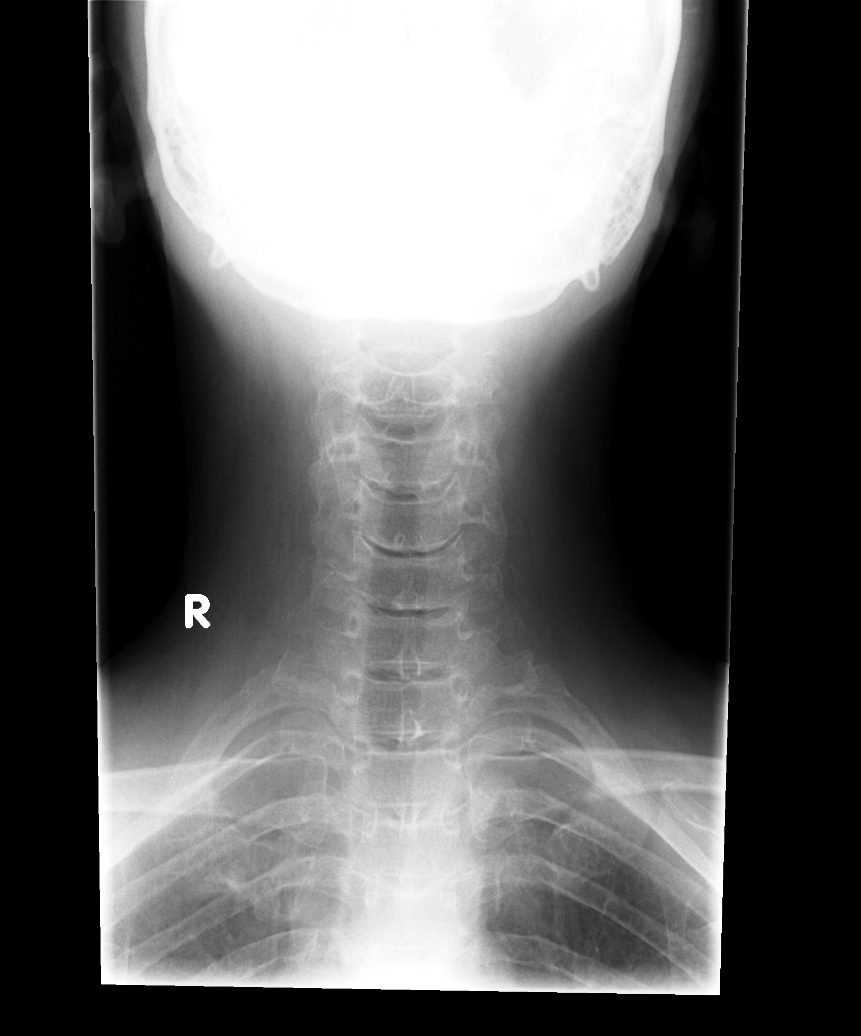

[view not recorded (5 of 5)]
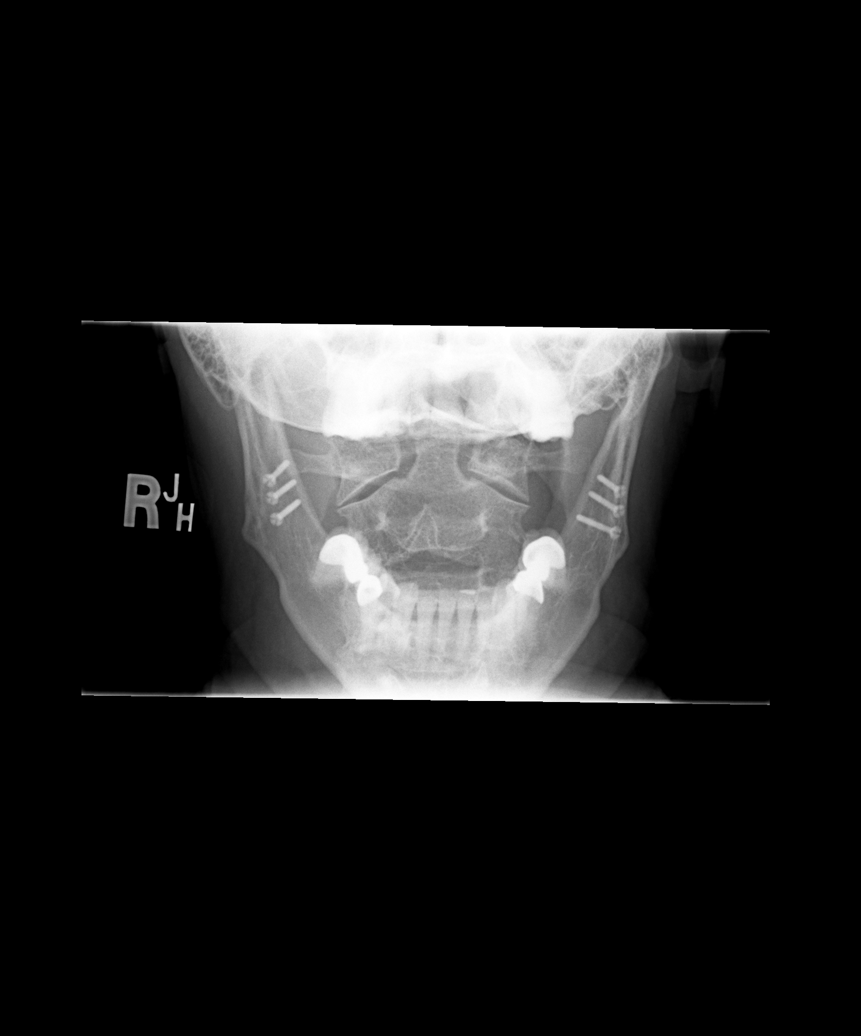

[5 of 5 positions shown; findings below may reference images not displayed]

FINDINGS: Mild cervical kyphosis at C5-6. Moderate disc degeneration and
spondylosis at C5-6 and C6-7 causing mild foraminal narrowing on the
right. Negative for fracture or mass. Prior mandible surgery with
screws bilaterally.
IMPRESSION: Cervical spondylosis at C5-6 and C6-7.

## 2014-05-08 DIAGNOSIS — S83241A Other tear of medial meniscus, current injury, right knee, initial encounter: Secondary | ICD-10-CM | POA: Diagnosis not present

## 2014-05-08 DIAGNOSIS — S83242A Other tear of medial meniscus, current injury, left knee, initial encounter: Secondary | ICD-10-CM | POA: Diagnosis not present

## 2014-06-26 DIAGNOSIS — L565 Disseminated superficial actinic porokeratosis (DSAP): Secondary | ICD-10-CM | POA: Diagnosis not present

## 2014-06-26 DIAGNOSIS — L821 Other seborrheic keratosis: Secondary | ICD-10-CM | POA: Diagnosis not present

## 2014-06-26 DIAGNOSIS — Z1231 Encounter for screening mammogram for malignant neoplasm of breast: Secondary | ICD-10-CM | POA: Diagnosis not present

## 2014-07-01 DIAGNOSIS — S83241D Other tear of medial meniscus, current injury, right knee, subsequent encounter: Secondary | ICD-10-CM | POA: Diagnosis not present

## 2014-07-01 DIAGNOSIS — S83242D Other tear of medial meniscus, current injury, left knee, subsequent encounter: Secondary | ICD-10-CM | POA: Diagnosis not present

## 2014-07-14 ENCOUNTER — Telehealth: Payer: Self-pay | Admitting: Family Medicine

## 2014-07-14 NOTE — Telephone Encounter (Signed)
Documented that pt had mammogram but no records.  LM for pt to CB and tell us where she had recent mammogram.

## 2014-07-31 ENCOUNTER — Encounter: Payer: Self-pay | Admitting: Family Medicine

## 2014-08-25 DIAGNOSIS — M170 Bilateral primary osteoarthritis of knee: Secondary | ICD-10-CM | POA: Diagnosis not present

## 2014-08-28 DIAGNOSIS — M170 Bilateral primary osteoarthritis of knee: Secondary | ICD-10-CM | POA: Diagnosis not present

## 2014-08-28 DIAGNOSIS — M1712 Unilateral primary osteoarthritis, left knee: Secondary | ICD-10-CM | POA: Diagnosis not present

## 2014-09-04 DIAGNOSIS — M1711 Unilateral primary osteoarthritis, right knee: Secondary | ICD-10-CM | POA: Diagnosis not present

## 2014-09-11 DIAGNOSIS — M1711 Unilateral primary osteoarthritis, right knee: Secondary | ICD-10-CM | POA: Diagnosis not present

## 2014-11-11 DIAGNOSIS — M25562 Pain in left knee: Secondary | ICD-10-CM | POA: Diagnosis not present

## 2014-11-13 DIAGNOSIS — M25462 Effusion, left knee: Secondary | ICD-10-CM | POA: Diagnosis not present

## 2014-11-13 DIAGNOSIS — M25562 Pain in left knee: Secondary | ICD-10-CM | POA: Diagnosis not present

## 2014-12-23 ENCOUNTER — Encounter: Payer: Self-pay | Admitting: Family Medicine

## 2014-12-23 ENCOUNTER — Ambulatory Visit (INDEPENDENT_AMBULATORY_CARE_PROVIDER_SITE_OTHER): Payer: Commercial Managed Care - HMO | Admitting: Family Medicine

## 2014-12-23 VITALS — BP 108/72 | HR 76 | Temp 98.3°F | Resp 16 | Ht 61.0 in | Wt 119.0 lb

## 2014-12-23 DIAGNOSIS — Z23 Encounter for immunization: Secondary | ICD-10-CM

## 2014-12-23 DIAGNOSIS — E2839 Other primary ovarian failure: Secondary | ICD-10-CM

## 2014-12-23 DIAGNOSIS — Z0000 Encounter for general adult medical examination without abnormal findings: Secondary | ICD-10-CM

## 2014-12-23 LAB — CBC WITH DIFFERENTIAL/PLATELET
BASOS ABS: 0.1 10*3/uL (ref 0.0–0.1)
BASOS PCT: 0.7 % (ref 0.0–3.0)
EOS ABS: 0.2 10*3/uL (ref 0.0–0.7)
Eosinophils Relative: 2 % (ref 0.0–5.0)
HCT: 36.5 % (ref 36.0–46.0)
HEMOGLOBIN: 12.1 g/dL (ref 12.0–15.0)
LYMPHS PCT: 24.1 % (ref 12.0–46.0)
Lymphs Abs: 2 10*3/uL (ref 0.7–4.0)
MCHC: 33 g/dL (ref 30.0–36.0)
MCV: 91.1 fl (ref 78.0–100.0)
MONOS PCT: 8 % (ref 3.0–12.0)
Monocytes Absolute: 0.7 10*3/uL (ref 0.1–1.0)
Neutro Abs: 5.4 10*3/uL (ref 1.4–7.7)
Neutrophils Relative %: 65.2 % (ref 43.0–77.0)
Platelets: 382 10*3/uL (ref 150.0–400.0)
RBC: 4.01 Mil/uL (ref 3.87–5.11)
RDW: 13.6 % (ref 11.5–15.5)
WBC: 8.3 10*3/uL (ref 4.0–10.5)

## 2014-12-23 LAB — COMPREHENSIVE METABOLIC PANEL
ALBUMIN: 3.9 g/dL (ref 3.5–5.2)
ALK PHOS: 95 U/L (ref 39–117)
ALT: 11 U/L (ref 0–35)
AST: 14 U/L (ref 0–37)
BUN: 13 mg/dL (ref 6–23)
CALCIUM: 9.7 mg/dL (ref 8.4–10.5)
CO2: 30 mEq/L (ref 19–32)
CREATININE: 0.66 mg/dL (ref 0.40–1.20)
Chloride: 104 mEq/L (ref 96–112)
GFR: 95.01 mL/min (ref >60.00)
Glucose, Bld: 84 mg/dL (ref 70–99)
Potassium: 5.1 mEq/L (ref 3.5–5.1)
Sodium: 141 mEq/L (ref 135–145)
Total Bilirubin: 0.6 mg/dL (ref 0.2–1.2)
Total Protein: 6.8 g/dL (ref 6.0–8.3)

## 2014-12-23 LAB — LIPID PANEL
Cholesterol: 188 mg/dL (ref 0–200)
HDL: 46.6 mg/dL (ref >39.00)
LDL Cholesterol: 121 mg/dL — ABNORMAL HIGH (ref 0–99)
NonHDL: 141.07
Total CHOL/HDL Ratio: 4
Triglycerides: 98 mg/dL (ref 0.0–149.0)
VLDL: 19.6 mg/dL (ref 0.0–40.0)

## 2014-12-23 LAB — TSH: TSH: 1.52 u[IU]/mL (ref 0.35–4.50)

## 2014-12-23 NOTE — Progress Notes (Signed)
Office Note 12/23/2014  CC:  Chief Complaint  Patient presents with  . Annual Exam    Pt is fasting.    HPI:  Brooke Lee is a 64 y.o. White female who is here for annual health maintenance exam. No acute complaints. Exercising some, eating healthy diet. Eye and dental exams UTD.   Past Medical History  Diagnosis Date  . 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 Abnormal CXR 04/22/2013    03/2013 CXR > very mild atelectasis vs scarring left lung .  Hx of pneumonia twice as a child.  Marland Kitchen. BAKER'S CYST, RIGHT KNEE 10/10/2006    Qualifier: Diagnosis of  By: Darrick PennaFIELDS MD, KARL    . Osteoarthritis of both knees     Past Surgical History  Procedure Laterality Date  . Tmj arthroscopy  remote past  . Abdominoplasty  remote past  . Breast biopsy  multiple    benign;   . Colonoscopy  approx 2011    normal; recall 10 yrs  . Knee arthroscopy  2015    Family History  Problem Relation Age of Onset  . Heart disease Mother   . Hypothyroidism Mother   . Heart disease Father   . Arthritis Father   . Hyperlipidemia Father   . Diabetes Father     Social History   Social History  . Marital Status: Married    Spouse Name: N/A  . Number of Children: N/A  . Years of Education: N/A   Occupational History  . Not on file.   Social History Main Topics  . Smoking status: Never Smoker   . Smokeless tobacco: Never Used  . Alcohol Use: 1.8 - 3.0 oz/week    3-5 Glasses of wine per week     Comment: weekly  . Drug Use: No  . Sexual Activity: Not on file   Other Topics Concern  . Not on file   Social History Narrative   Married, two children, one grandchild.   Retired: Former Psychologist, sport and exercisebusiness owner, Conservation officer, historic buildingscopiers.   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   NO smoking.   Alc: avg 1 glass wine per night.   Exercise: wt training 3X per week, walking, yoga.            MEDS: diclofenac 75mg  bid prn  Allergies  Allergen Reactions  . Benadryl  [Diphenhydramine Hcl]     Heart races    ROS Review of Systems  Constitutional: Negative for fever, chills, appetite change and fatigue.  HENT: Negative for congestion, dental problem, ear pain and sore throat.   Eyes: Negative for discharge, redness and visual disturbance.  Respiratory: Negative for cough, chest tightness, shortness of breath and wheezing.   Cardiovascular: Negative for chest pain, palpitations and leg swelling.  Gastrointestinal: Negative for nausea, vomiting, abdominal pain, diarrhea and blood in stool.  Genitourinary: Negative for dysuria, urgency, frequency, hematuria, flank pain and difficulty urinating.  Musculoskeletal: Positive for arthralgias (mild bilat knee pain,chronic). Negative for myalgias, back pain, joint swelling and neck stiffness.  Skin: Negative for pallor and rash.  Neurological: Negative for dizziness, speech difficulty, weakness and headaches.  Hematological: Negative for adenopathy. Does not bruise/bleed easily.  Psychiatric/Behavioral: Negative for confusion and sleep disturbance. The patient is not nervous/anxious.     PE; Blood pressure 108/72, pulse 76, temperature 98.3 F (36.8 C), temperature source Oral, resp. rate 16, height 5\' 1"  (1.549 m), weight 119 lb (53.978 kg), SpO2 99 %.  Gen: Alert, well appearing.  Patient is oriented to person, place, time, and situation. AFFECT: pleasant, lucid thought and speech. ENT: Ears: EACs clear, normal epithelium.  TMs with good light reflex and landmarks bilaterally.  Eyes: no injection, icteris, swelling, or exudate.  EOMI, PERRLA. Nose: no drainage or turbinate edema/swelling.  No injection or focal lesion.  Mouth: lips without lesion/swelling.  Oral mucosa pink and moist.  Dentition intact and without obvious caries or gingival swelling.  Oropharynx without erythema, exudate, or swelling.  Neck: supple/nontender.  No LAD, mass, or TM.  Carotid pulses 2+ bilaterally, without bruits. CV: RRR, no  m/r/g.   LUNGS: CTA bilat, nonlabored resps, good aeration in all lung fields. ABD: soft, NT, ND, BS normal.  No hepatospenomegaly or mass.  No bruits. EXT: no clubbing, cyanosis, or edema.  Musculoskeletal: no joint swelling, erythema, warmth, or tenderness.  ROM of all joints intact. Skin - no sores or suspicious lesions or rashes or color changes   Pertinent labs:  Lab Results  Component Value Date   TSH 1.52 10/03/2013   Lab Results  Component Value Date   WBC 9.0 10/03/2013   HGB 13.1 10/03/2013   HCT 40.3 10/03/2013   MCV 90.3 10/03/2013   PLT 351.0 10/03/2013   Lab Results  Component Value Date   CREATININE 1.0 10/03/2013   BUN 18 10/03/2013   NA 134* 10/03/2013   K 4.8 10/03/2013   CL 99 10/03/2013   CO2 26 10/03/2013   Lab Results  Component Value Date   ALT 13 10/03/2013   AST 17 10/03/2013   ALKPHOS 91 10/03/2013   BILITOT 0.6 10/03/2013   Lab Results  Component Value Date   CHOL 210* 10/03/2013   Lab Results  Component Value Date   HDL 51.90 10/03/2013   Lab Results  Component Value Date   LDLCALC 136* 10/03/2013   Lab Results  Component Value Date   TRIG 112.0 10/03/2013   Lab Results  Component Value Date   CHOLHDL 4 10/03/2013   ASSESSMENT AND PLAN:   Health maintenance exam: Reviewed age and gender appropriate health maintenance issues (prudent diet, regular exercise, health risks of tobacco and excessive alcohol, use of seatbelts, fire alarms in home, use of sunscreen).  Also reviewed age and gender appropriate health screening as well as vaccine recommendations. Flu vaccine and prevnar 13 today. Discussed vit D and Calcium recommendations for prevention of osteoporosis. DEXA scan ordered today. Fasting health panel labs ordered today. She will schedule visit with her GYN ASAP for routine f/u.  An After Visit Summary was printed and given to the patient.   FOLLOW UP:  Return in about 1 year (around 12/23/2015) for annual CPE  (fasting).

## 2014-12-23 NOTE — Progress Notes (Signed)
Pre visit review using our clinic review tool, if applicable. No additional management support is needed unless otherwise documented below in the visit note. 

## 2014-12-30 DIAGNOSIS — M810 Age-related osteoporosis without current pathological fracture: Secondary | ICD-10-CM

## 2014-12-30 HISTORY — DX: Age-related osteoporosis without current pathological fracture: M81.0

## 2015-01-26 DIAGNOSIS — M810 Age-related osteoporosis without current pathological fracture: Secondary | ICD-10-CM | POA: Diagnosis not present

## 2015-01-26 DIAGNOSIS — M85851 Other specified disorders of bone density and structure, right thigh: Secondary | ICD-10-CM | POA: Diagnosis not present

## 2015-02-04 ENCOUNTER — Encounter: Payer: Self-pay | Admitting: Family Medicine

## 2015-02-04 ENCOUNTER — Telehealth: Payer: Self-pay | Admitting: Family Medicine

## 2015-02-04 NOTE — Telephone Encounter (Signed)
Pls call pt and notify her that her bone density testing showed she has osteoporosis. I recommend she continue vit D and calcium, plus I recommend she start an additional medication to help slow down the thinning of her bones.  If pt agreeable, pls eRx fosamax generic 70 mg tabs, 1 tab po q week, #4, RF x 12.  Tell her to take this med first thing in the morning on empty stomach with full glass of water and remain sitting upright for 30 min after taking it. We'll repeat her bone density test in 2 yrs.-thx

## 2015-02-05 NOTE — Telephone Encounter (Signed)
Pt advised and voiced understanding.  She stated that she would like a copy of her BMD and a copy of her most recent labs mailed to her along with the instructions below about taking the fosamax. She stated that she wants to look into the medication and see if this is something she would want to take.  I confirmed pts address. Copies of BMD, labs and message below printed and mailed to pt.

## 2015-02-05 NOTE — Telephone Encounter (Signed)
OK 

## 2015-02-10 ENCOUNTER — Encounter: Payer: Self-pay | Admitting: Family Medicine

## 2015-02-26 DIAGNOSIS — M25562 Pain in left knee: Secondary | ICD-10-CM | POA: Diagnosis not present

## 2015-03-10 DIAGNOSIS — H5210 Myopia, unspecified eye: Secondary | ICD-10-CM | POA: Diagnosis not present

## 2015-03-10 DIAGNOSIS — H5203 Hypermetropia, bilateral: Secondary | ICD-10-CM | POA: Diagnosis not present

## 2015-06-29 DIAGNOSIS — M25561 Pain in right knee: Secondary | ICD-10-CM | POA: Diagnosis not present

## 2015-06-29 DIAGNOSIS — M170 Bilateral primary osteoarthritis of knee: Secondary | ICD-10-CM | POA: Diagnosis not present

## 2015-06-29 DIAGNOSIS — R262 Difficulty in walking, not elsewhere classified: Secondary | ICD-10-CM | POA: Diagnosis not present

## 2015-06-29 DIAGNOSIS — M25562 Pain in left knee: Secondary | ICD-10-CM | POA: Diagnosis not present

## 2015-07-06 DIAGNOSIS — M25561 Pain in right knee: Secondary | ICD-10-CM | POA: Diagnosis not present

## 2015-07-06 DIAGNOSIS — M170 Bilateral primary osteoarthritis of knee: Secondary | ICD-10-CM | POA: Diagnosis not present

## 2015-07-06 DIAGNOSIS — M25562 Pain in left knee: Secondary | ICD-10-CM | POA: Diagnosis not present

## 2015-07-09 DIAGNOSIS — M25561 Pain in right knee: Secondary | ICD-10-CM | POA: Diagnosis not present

## 2015-07-09 DIAGNOSIS — M170 Bilateral primary osteoarthritis of knee: Secondary | ICD-10-CM | POA: Diagnosis not present

## 2015-07-09 DIAGNOSIS — M25562 Pain in left knee: Secondary | ICD-10-CM | POA: Diagnosis not present

## 2015-07-15 DIAGNOSIS — M170 Bilateral primary osteoarthritis of knee: Secondary | ICD-10-CM | POA: Diagnosis not present

## 2015-07-15 DIAGNOSIS — M25562 Pain in left knee: Secondary | ICD-10-CM | POA: Diagnosis not present

## 2015-07-15 DIAGNOSIS — M25561 Pain in right knee: Secondary | ICD-10-CM | POA: Diagnosis not present

## 2015-07-22 DIAGNOSIS — M25561 Pain in right knee: Secondary | ICD-10-CM | POA: Diagnosis not present

## 2015-07-22 DIAGNOSIS — M170 Bilateral primary osteoarthritis of knee: Secondary | ICD-10-CM | POA: Diagnosis not present

## 2015-07-22 DIAGNOSIS — M25562 Pain in left knee: Secondary | ICD-10-CM | POA: Diagnosis not present

## 2015-09-02 ENCOUNTER — Telehealth: Payer: Self-pay | Admitting: Family Medicine

## 2015-09-02 DIAGNOSIS — M1712 Unilateral primary osteoarthritis, left knee: Secondary | ICD-10-CM

## 2015-09-02 DIAGNOSIS — M25562 Pain in left knee: Secondary | ICD-10-CM

## 2015-09-02 NOTE — Telephone Encounter (Signed)
Pt advised and voiced understanding.   

## 2015-09-02 NOTE — Telephone Encounter (Signed)
Patient is requesting referral to Dr. Despina HickAlusio [facility] knee replacement.

## 2015-09-02 NOTE — Telephone Encounter (Signed)
Please advise. Thanks.  

## 2015-09-02 NOTE — Telephone Encounter (Signed)
Referral ordered as per pt request. 

## 2015-11-26 ENCOUNTER — Telehealth: Payer: Self-pay | Admitting: Family Medicine

## 2015-11-26 DIAGNOSIS — H9202 Otalgia, left ear: Secondary | ICD-10-CM | POA: Diagnosis not present

## 2015-11-26 DIAGNOSIS — M810 Age-related osteoporosis without current pathological fracture: Secondary | ICD-10-CM

## 2015-11-26 DIAGNOSIS — E2839 Other primary ovarian failure: Secondary | ICD-10-CM

## 2015-11-26 DIAGNOSIS — H6123 Impacted cerumen, bilateral: Secondary | ICD-10-CM | POA: Diagnosis not present

## 2015-11-26 NOTE — Telephone Encounter (Signed)
OK, bone density scan ordered at [facility] [place] (which is where she got her last one done in 2016).

## 2015-11-26 NOTE — Telephone Encounter (Signed)
Patient notified and verbalized understanding. 

## 2015-11-26 NOTE — Telephone Encounter (Signed)
Patient called requesting a bone density test. Please advise.

## 2015-12-02 DIAGNOSIS — H66003 Acute suppurative otitis media without spontaneous rupture of ear drum, bilateral: Secondary | ICD-10-CM | POA: Diagnosis not present

## 2015-12-03 ENCOUNTER — Telehealth: Payer: Self-pay | Admitting: Family Medicine

## 2015-12-03 NOTE — Telephone Encounter (Signed)
I put this patient on Dr Alan RipperKuneff's schedule tomorrow.  FYI

## 2015-12-03 NOTE — Telephone Encounter (Signed)
[facility] - Client TELEPHONE ADVICE RECORD [facility] Patient Name: Brooke HailLINDA Brendlinger DOB: [birth year] Initial Comment Caller states she was given amoxicillin for a ear infection and she has been having vomiting and diarrhea. Nurse Assessment Nurse: Brooke RidgelGaddy, RN, Brooke Lee Date/Time (Eastern Time): 12/03/2015 3:57:39 PM Confirm and document reason for call. If symptomatic, describe symptoms. You must click the next button to save text entered. ---PT was given amoxicillin for ear infection at a fast med in [place] on [place]. Now she is vomiting (4-5 x and not related to the medication) and diarrhea onset last night at 10:30 pm to 11 pm and ended 6 am. She is drinking some liquids - gatoraid and crackers. No fever. She only took antx 2x yesterday. Bilateral Ear pain is the same. Has the patient traveled out of the country within the last 30 days? ---No Does the patient have any new or worsening symptoms? ---Yes Will a triage be completed? ---Yes Related visit to physician within the last 2 weeks? ---Brooke SaYesDoes the PT have any chronic conditions? (i.e. diabetes, asthma, etc.) ---No Is this a behavioral health or substance abuse call? ---No Guidelines Guideline Title Affirmed Question Affirmed Notes Vomiting [1] MODERATE vomiting (e.g., 3 - 5 times/ day) AND [2] age > 8960 Final Disposition User Go to ED Now (or PCP triage) Brooke RidgelGaddy, RN, Brooke Lee Comments no red or pink swelling behind ear. mild ear pain - no ear drainage and not dizzy has urinated in last 12 h and wet offered to try to make appointment - thought it is pretty late and she is not going to be close enough to office to get there. - she is able to keep fluids down now and she attributes her vomiting to the amoxicillin and wants MD to change her antx over the phone. PT REQUESTS A CALL BACK FROM OFFICE AND REFUSING ER. (told her MDs dont usually order antx without seeing pt (she was seen  elsewhre - but will pass on her request) she declined Telehealth option bc she is not sure if antx will be changed with that option - she feels she needs different antx. shewants MD/office to call her back [facility]'s symptoms and request and that she refused to go to ER or have appointment Comments Brooke Lee will call caller Referrals GO TO FACILITY REFUSED Call Id: [phone] Disagree/Comply: Comply

## 2015-12-04 ENCOUNTER — Ambulatory Visit (INDEPENDENT_AMBULATORY_CARE_PROVIDER_SITE_OTHER): Payer: Commercial Managed Care - HMO | Admitting: Family Medicine

## 2015-12-04 ENCOUNTER — Encounter: Payer: Self-pay | Admitting: Family Medicine

## 2015-12-04 VITALS — BP 109/73 | HR 78 | Temp 98.4°F | Resp 20 | Wt 132.0 lb

## 2015-12-04 DIAGNOSIS — H60391 Other infective otitis externa, right ear: Secondary | ICD-10-CM

## 2015-12-04 MED ORDER — NEOMYCIN-POLYMYXIN-HC 1 % OT SOLN: 3.0000 [drp] | Freq: Four times a day (QID) | OTIC | 0 refills | Status: DC

## 2015-12-04 NOTE — Telephone Encounter (Signed)
Noted- patient seen today

## 2015-12-04 NOTE — Patient Instructions (Signed)
I have called in ear drops for you.  It was a pleasure meeting you.    Otitis Externa Otitis externa is a bacterial or fungal infection of the outer ear canal. This is the area from the eardrum to the outside of the ear. Otitis externa is sometimes called "swimmer's ear." CAUSES  Possible causes of infection include:  Swimming in dirty water.  Moisture remaining in the ear after swimming or bathing.  Mild injury (trauma) to the ear.  Objects stuck in the ear (foreign body).  Cuts or scrapes (abrasions) on the outside of the ear. SIGNS AND SYMPTOMS  The first symptom of infection is often itching in the ear canal. Later signs and symptoms may include swelling and redness of the ear canal, ear pain, and yellowish-white fluid (pus) coming from the ear. The ear pain may be worse when pulling on the earlobe. DIAGNOSIS  Your health care provider will perform a physical exam. A sample of fluid may be taken from the ear and examined for bacteria or fungi. TREATMENT  Antibiotic ear drops are often given for 10 to 14 days. Treatment may also include pain medicine or corticosteroids to reduce itching and swelling. HOME CARE INSTRUCTIONS   Apply antibiotic ear drops to the ear canal as prescribed by your health care provider.  Take medicines only as directed by your health care provider.  If you have diabetes, follow any additional treatment instructions from your health care provider.  Keep all follow-up visits as directed by your health care provider. PREVENTION   Keep your ear dry. Use the corner of a towel to absorb water out of the ear canal after swimming or bathing.  Avoid scratching or putting objects inside your ear. This can damage the ear canal or remove the protective wax that lines the canal. This makes it easier for bacteria and fungi to grow.  Avoid swimming in lakes, polluted water, or poorly chlorinated pools.  You may use ear drops made of rubbing alcohol and vinegar  after swimming. Combine equal parts of white vinegar and alcohol in a bottle. Put 3 or 4 drops into each ear after swimming. SEEK MEDICAL CARE IF:   You have a fever.  Your ear is still red, swollen, painful, or draining pus after 3 days.  Your redness, swelling, or pain gets worse.  You have a severe headache.  You have redness, swelling, pain, or tenderness in the area behind your ear. MAKE SURE YOU:   Understand these instructions.  Will watch your condition.  Will get help right away if you are not doing well or get worse.   This information is not intended to replace advice given to you by your health care provider. Make sure you discuss any questions you have with your health care provider.   Document Released: 02/14/2005 Document Revised: 03/07/2014 Document Reviewed: 03/03/2011 Elsevier Interactive Patient Education [employer] Elsevier Inc.

## 2015-12-04 NOTE — Progress Notes (Signed)
Brooke CroakLinda A Lee , Dec 19, 1950, 65 y.o., female MRN: [id number] Patient Care Team    Relationship Specialty Notifications Start End  Brooke MassedPhilip H McGowen, MD PCP - General Family Medicine  10/03/13    Comment: Patient request (Merged)  Brooke Marvelobert Wainer, MD Consulting Physician Orthopedic Surgery  11/10/13     CC: ear infection Subjective: Pt presents for an acute OV with complaints of ear pain bilaterally of 10 days duration. [facility] and [facility] Aug[facility]in. She was told her right ear was infected after wax removal was completed. The Augmentin caused her to be ill and vomit.  Sh estates her right ear was painful with sharp pains, this has resolved. She denies fever, chills, nausea or vomit now. She endorses Nasal congestion/sinus. She is Eating an drinking well. She states she called back the clinic to tell them of the problem with medication and they called her ear drops, that she did not pick up.   Allergies  Allergen Reactions  . Amoxicillin-Pot Clavulanate Nausea And Vomiting  . Benadryl [Diphenhydramine Hcl]     Heart races   Social History  Substance Use Topics  . Smoking status: Never Smoker  . Smokeless tobacco: Never Used  . Alcohol use 1.8 - 3.0 oz/week    3 - 5 Glasses of wine per week     Comment: weekly   Past Medical History:  Diagnosis Date  . Abnormal CXR 04/22/2013   03/2013 CXR > very mild atelectasis vs scarring left lung .  Hx of pneumonia twice as a child.  Marland Kitchen. BAKER'S CYST, RIGHT KNEE 10/10/2006   Qualifier: Diagnosis of  By: Darrick PennaFIELDS MD, Brooke    . Osteoarthritis of both knees   . Osteoporosis 12/2014   Repeat DEXA 2 yrs  . TMJ locking 1990s   Resolved s/p surg   Past Surgical History:  Procedure Laterality Date  . ABDOMINOPLASTY  remote past  . BREAST BIOPSY  multiple   benign;   . COLONOSCOPY  approx 2011   normal; recall 10 yrs  . KNEE ARTHROSCOPY  2015  . TMJ ARTHROSCOPY  remote past   Family History  Problem Relation Age of  Onset  . Heart disease Mother   . Hypothyroidism Mother   . Heart disease Father   . Arthritis Father   . Hyperlipidemia Father   . Diabetes Father      Medication List       Accurate as of 12/04/15 11:08 AM. Always use your most recent med list.          acyclovir 200 MG capsule Commonly known as:  ZOVIRAX   ALPRAZolam 1 MG tablet Commonly known as:  XANAX   Ear Wax Drops 6.5 % Soln Place five drops into both ears 2 (two) times daily.       No results found for this or any previous visit (from the past 24 hour(s)). No results found.   ROS: Negative, with the exception of above mentioned in HPI   Objective:  BP 109/73 (BP Location: Left Arm, Patient Position: Sitting, Cuff Size: Normal)   Pulse 78   Temp 98.4 F (36.9 C)   Resp 20   Wt 132 lb (59.9 kg)   SpO2 99%   BMI 24.94 kg/m  Body mass index is 24.94 kg/m. Gen: Afebrile. No acute distress. Nontoxic in appearance, well developed, well nourished. Very pleasant caucasian female.  HENT: AT. [place]. Bilateral TM visualized and normal in appearance. EAM left  normal, small amount of cerumen. Right EAM with mild erythema, and exudative material in canal.  MMM, no oral lesions. Bilateral nares wnl. Throat without erythema or exudates.  Eyes:Pupils Equal Round Reactive to light, Extraocular movements intact,  Conjunctiva without redness, discharge or icterus. Neck/lymp/endocrine: Supple,no lymphadenopathy CV: RRR, no edema Chest: CTAB, no wheeze or crackles. Good air movement, normal resp effort.  Skin: no rashes, purpura or petechiae.  Neuro:  Normal gait. PERLA. EOMi. Alert. Oriented x3   Assessment/Plan: Brooke Lee is a 65 y.o. female present for acute OV for  Infective otitis externa of right ear - ear drops for 7 days.  - NEOMYCIN-POLYMYXIN-HYDROCORTISONE (CORTISPORIN) 1 % SOLN otic solution; Place 3 drops into the right ear 4 (four) times daily.  Dispense: 10 mL; Refill: 0 - F/U PRN  electronically  signed by:  Brooke Pacini, DO  [facility] [place]

## 2015-12-07 ENCOUNTER — Other Ambulatory Visit: Payer: Self-pay | Admitting: Family Medicine

## 2015-12-07 DIAGNOSIS — Z0000 Encounter for general adult medical examination without abnormal findings: Secondary | ICD-10-CM

## 2015-12-24 ENCOUNTER — Encounter: Payer: Commercial Managed Care - HMO | Admitting: Family Medicine

## 2016-01-12 DIAGNOSIS — L565 Disseminated superficial actinic porokeratosis (DSAP): Secondary | ICD-10-CM | POA: Diagnosis not present

## 2016-01-12 DIAGNOSIS — L821 Other seborrheic keratosis: Secondary | ICD-10-CM | POA: Diagnosis not present

## 2016-01-27 ENCOUNTER — Encounter: Payer: Self-pay | Admitting: Family Medicine

## 2016-01-27 LAB — HM DEXA SCAN

## 2016-01-27 LAB — HM MAMMOGRAPHY

## 2016-01-29 ENCOUNTER — Telehealth: Payer: Self-pay

## 2016-01-29 NOTE — Telephone Encounter (Signed)
LM for patient to return call to schedule AWV. Possible for Mon 02/01/16 at 8 (fasting labs at 9 for CPE), if pt agrees.

## 2016-02-01 ENCOUNTER — Encounter: Payer: Self-pay | Admitting: Family Medicine

## 2016-02-01 ENCOUNTER — Other Ambulatory Visit (INDEPENDENT_AMBULATORY_CARE_PROVIDER_SITE_OTHER): Payer: Commercial Managed Care - HMO

## 2016-02-01 DIAGNOSIS — E559 Vitamin D deficiency, unspecified: Secondary | ICD-10-CM | POA: Diagnosis not present

## 2016-02-01 DIAGNOSIS — Z0000 Encounter for general adult medical examination without abnormal findings: Secondary | ICD-10-CM | POA: Diagnosis not present

## 2016-02-01 LAB — COMPREHENSIVE METABOLIC PANEL
ALT: 8 U/L (ref 0–35)
AST: 12 U/L (ref 0–37)
Albumin: 4.4 g/dL (ref 3.5–5.2)
Alkaline Phosphatase: 67 U/L (ref 39–117)
BILIRUBIN TOTAL: 0.7 mg/dL (ref 0.2–1.2)
BUN: 17 mg/dL (ref 6–23)
CHLORIDE: 106 meq/L (ref 96–112)
CO2: 29 meq/L (ref 19–32)
CREATININE: 0.73 mg/dL (ref 0.40–1.20)
Calcium: 9.9 mg/dL (ref 8.4–10.5)
GFR: 84.29 mL/min (ref >60.00)
GLUCOSE: 92 mg/dL (ref 70–99)
Potassium: 4.5 mEq/L (ref 3.5–5.1)
Sodium: 142 mEq/L (ref 135–145)
Total Protein: 6.8 g/dL (ref 6.0–8.3)

## 2016-02-01 LAB — CBC WITH DIFFERENTIAL/PLATELET
BASOS PCT: 0.7 % (ref 0.0–3.0)
Basophils Absolute: 0 10*3/uL (ref 0.0–0.1)
EOS PCT: 2.7 % (ref 0.0–5.0)
Eosinophils Absolute: 0.2 10*3/uL (ref 0.0–0.7)
HEMATOCRIT: 38 % (ref 36.0–46.0)
Hemoglobin: 12.6 g/dL (ref 12.0–15.0)
LYMPHS ABS: 2.3 10*3/uL (ref 0.7–4.0)
Lymphocytes Relative: 37.2 % (ref 12.0–46.0)
MCHC: 33.3 g/dL (ref 30.0–36.0)
MCV: 91.9 fl (ref 78.0–100.0)
MONOS PCT: 8.2 % (ref 3.0–12.0)
Monocytes Absolute: 0.5 10*3/uL (ref 0.1–1.0)
NEUTROS ABS: 3.2 10*3/uL (ref 1.4–7.7)
NEUTROS PCT: 51.2 % (ref 43.0–77.0)
PLATELETS: 306 10*3/uL (ref 150.0–400.0)
RBC: 4.13 Mil/uL (ref 3.87–5.11)
RDW: 13.8 % (ref 11.5–15.5)
WBC: 6.3 10*3/uL (ref 4.0–10.5)

## 2016-02-01 LAB — LIPID PANEL
CHOL/HDL RATIO: 3
Cholesterol: 197 mg/dL (ref 0–200)
HDL: 57 mg/dL (ref >39.00)
LDL CALC: 114 mg/dL — AB (ref 0–99)
NONHDL: 140.15
Triglycerides: 133 mg/dL (ref 0.0–149.0)
VLDL: 26.6 mg/dL (ref 0.0–40.0)

## 2016-02-01 LAB — VITAMIN D 25 HYDROXY (VIT D DEFICIENCY, FRACTURES): VITD: 31.03 ng/mL (ref 30.00–100.00)

## 2016-02-01 LAB — TSH: TSH: 1.66 u[IU]/mL (ref 0.35–4.50)

## 2016-02-09 ENCOUNTER — Encounter: Payer: Commercial Managed Care - HMO | Admitting: Family Medicine

## 2016-02-29 DIAGNOSIS — E559 Vitamin D deficiency, unspecified: Secondary | ICD-10-CM

## 2016-02-29 HISTORY — DX: Vitamin D deficiency, unspecified: E55.9

## 2016-03-14 ENCOUNTER — Encounter: Payer: Commercial Managed Care - HMO | Admitting: Family Medicine

## 2016-03-28 ENCOUNTER — Encounter: Payer: Self-pay | Admitting: Family Medicine

## 2016-03-28 ENCOUNTER — Ambulatory Visit (INDEPENDENT_AMBULATORY_CARE_PROVIDER_SITE_OTHER): Payer: Medicare HMO | Admitting: Family Medicine

## 2016-03-28 VITALS — BP 124/70 | HR 85 | Temp 97.9°F | Resp 16 | Ht 60.75 in | Wt 137.5 lb

## 2016-03-28 DIAGNOSIS — E559 Vitamin D deficiency, unspecified: Secondary | ICD-10-CM | POA: Diagnosis not present

## 2016-03-28 DIAGNOSIS — Z1159 Encounter for screening for other viral diseases: Secondary | ICD-10-CM | POA: Diagnosis not present

## 2016-03-28 DIAGNOSIS — Z23 Encounter for immunization: Secondary | ICD-10-CM

## 2016-03-28 DIAGNOSIS — M810 Age-related osteoporosis without current pathological fracture: Secondary | ICD-10-CM

## 2016-03-28 DIAGNOSIS — Z0000 Encounter for general adult medical examination without abnormal findings: Secondary | ICD-10-CM

## 2016-03-28 MED ORDER — VITAMIN D (ERGOCALCIFEROL) 1.25 MG (50000 UNIT) PO CAPS: 50000.0000 [IU] | ORAL_CAPSULE | ORAL | 0 refills | Status: AC

## 2016-03-28 NOTE — Progress Notes (Signed)
Office Note 03/28/2016  CC:  Chief Complaint  Patient presents with  . Annual Exam    labs done 02/01/16    HPI:  Brooke Lee is a 66 y.o. White female who is here for annual health maintenance exam. We reviewed the labs she had done 01/2016 in detail today.  Vit D 31, o/w all normal. GYN: [facility]: pap not UTD per pt but mammo UTD.  I encouraged her to go to her GYN and continue with cervical ca screening until her GYN says to stop.  Pt has started taking Algae Cal since we got her DEXA results back and it showed worsened bone density in hip (T-score -3.3).  Currently takes 5000 U vit D per day.  We reviewed DEXA today and she had some old DEXA numbers with her.  Her older DEXA showed that she had actually stayed stable or slightly improved the bone density in spine and "total femur", and the T scores in the femoral necks are the only ones that worsened.  She says she has read a lot about bisphosphonates and she is afraid of them.  She asked if taking one for 6 mo would help "jumpstart" her bone density and then allow her to go off the med and do Algae Cal and vit D by themselves.  I told her I really don't know the answer to that question. She is active/exercises.  She does not smoke.  Eye exam: UTD. Dental exams: UTD.  Past Medical History:  Diagnosis Date  . Abnormal CXR 04/22/2013   03/2013 CXR > very mild atelectasis vs scarring left lung .  Hx of pneumonia twice as a child.  Marland Kitchen BAKER'S CYST, RIGHT KNEE 10/10/2006   Qualifier: Diagnosis of  By: Darrick Penna MD, KARL    . Osteoarthritis of both knees   . Osteoporosis 12/2014; 01/27/16   T-score -3.1 R femoral neck 12/2016, recommended bisphosphonate but pt declined.  . TMJ locking 1990s   Resolved s/p surg    Past Surgical History:  Procedure Laterality Date  . ABDOMINOPLASTY  remote past  . BREAST BIOPSY  multiple   benign;   . COLONOSCOPY  approx 2011   normal; recall 10 yrs  . KNEE ARTHROSCOPY  2015  . TMJ  ARTHROSCOPY  remote past    Family History  Problem Relation Age of Onset  . Heart disease Mother   . Hypothyroidism Mother   . Heart disease Father   . Arthritis Father   . Hyperlipidemia Father   . Diabetes Father     Social History   Social History  . Marital status: Married    Spouse name: N/A  . Number of children: N/A  . Years of education: N/A   Occupational History  . Not on file.   Social History Main Topics  . Smoking status: Never Smoker  . Smokeless tobacco: Never Used  . Alcohol use 1.8 - 3.0 oz/week    3 - 5 Glasses of wine per week     Comment: weekly  . Drug use: No  . Sexual activity: Not on file   Other Topics Concern  . Not on file   Social History Narrative   Married, two children, one grandchild.   Retired: Former Psychologist, sport and exercise, Conservation officer, historic buildings.   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   NO smoking.   Alc: avg 1 glass wine per night.   Exercise: wt training 3X per week, walking, yoga.  MEDS: acyclovir 200mg   Allergies  Allergen Reactions  . Amoxicillin-Pot Clavulanate Nausea And Vomiting  . Benadryl [Diphenhydramine Hcl]     Heart races    ROS Review of Systems  Constitutional: Negative for appetite change, chills, fatigue and fever.  HENT: Negative for congestion, dental problem, ear pain and sore throat.   Eyes: Negative for discharge, redness and visual disturbance.  Respiratory: Negative for cough, chest tightness, shortness of breath and wheezing.   Cardiovascular: Negative for chest pain, palpitations and leg swelling.  Gastrointestinal: Negative for abdominal pain, blood in stool, diarrhea, nausea and vomiting.  Genitourinary: Negative for difficulty urinating, dysuria, flank pain, frequency, hematuria and urgency.  Musculoskeletal: Negative for arthralgias, back pain, joint swelling, myalgias and neck stiffness.  Skin: Negative for pallor and rash.  Neurological: Negative for  dizziness, speech difficulty, weakness and headaches.  Hematological: Negative for adenopathy. Does not bruise/bleed easily.  Psychiatric/Behavioral: Negative for confusion and sleep disturbance. The patient is not nervous/anxious.     PE; Blood pressure 124/70, pulse 85, temperature 97.9 F (36.6 C), temperature source Oral, resp. rate 16, height 5' 0.75" (1.543 m), weight 137 lb 8 oz (62.4 kg), SpO2 97 %.  Pt examined with Wallace KellerHeather Kirby, CMA, as chaperone.  Gen: Alert, well appearing.  Patient is oriented to person, place, time, and situation. AFFECT: pleasant, lucid thought and speech. ENT: Ears: EACs clear, normal epithelium.  TMs with good light reflex and landmarks bilaterally.  Eyes: no injection, icteris, swelling, or exudate.  EOMI, PERRLA. Nose: no drainage or turbinate edema/swelling.  No injection or focal lesion.  Mouth: lips without lesion/swelling.  Oral mucosa pink and moist.  Dentition intact and without obvious caries or gingival swelling.  Oropharynx without erythema, exudate, or swelling.  Neck: supple/nontender.  No LAD, mass, or TM.  Carotid pulses 2+ bilaterally, without bruits. CV: RRR, no m/r/g.   LUNGS: CTA bilat, nonlabored resps, good aeration in all lung fields. ABD: soft, NT, ND, BS normal.  No hepatospenomegaly or mass.  No bruits. EXT: no clubbing, cyanosis, or edema.  Musculoskeletal: no joint swelling, erythema, warmth, or tenderness.  ROM of all joints intact. Skin - no sores or suspicious lesions or rashes or color changes   Pertinent labs:  Lab Results  Component Value Date   TSH 1.66 02/01/2016   Lab Results  Component Value Date   WBC 6.3 02/01/2016   HGB 12.6 02/01/2016   HCT 38.0 02/01/2016   MCV 91.9 02/01/2016   PLT 306.0 02/01/2016   Lab Results  Component Value Date   CREATININE 0.73 02/01/2016   BUN 17 02/01/2016   NA 142 02/01/2016   K 4.5 02/01/2016   CL 106 02/01/2016   CO2 29 02/01/2016   Lab Results  Component Value Date    ALT 8 02/01/2016   AST 12 02/01/2016   ALKPHOS 67 02/01/2016   BILITOT 0.7 02/01/2016   Lab Results  Component Value Date   CHOL 197 02/01/2016   Lab Results  Component Value Date   HDL 57.00 02/01/2016   Lab Results  Component Value Date   LDLCALC 114 (H) 02/01/2016   Lab Results  Component Value Date   TRIG 133.0 02/01/2016   Lab Results  Component Value Date   CHOLHDL 3 02/01/2016   Vitamin D level 02/01/16 = 31 ng/ml  ASSESSMENT AND PLAN:   Health maintenance exam: Reviewed age and gender appropriate health maintenance issues (prudent diet, regular exercise, health risks of tobacco and excessive alcohol, use of  seatbelts, fire alarms in home, use of sunscreen).  Also reviewed age and gender appropriate health screening as well as vaccine recommendations. Flu vaccine given today.  Reviewed all health panel labs with pt today in detail.  Will obtain Hep C screening today. Colon cancer screening: next colonoscopy 2021. Recommended pt make appt with her GYN for cervical ca screening. Breast ca screening: mammogram UTD.  Osteoporosis: patient had some questions that I could not answer about her bone density scores and bisphosphonate treatment.  I offered her a referral to Dr. Elvera Lennox in endocrinology so she could discuss this with a specialist and the patient accepted this---referral ordered today.  Meanwhile, continue algae cal and I'll also give 3 mo of high dose vit D supplement (50K U q week).  Recheck vit D level after this (she'll resume her current vit D dosing of 5000 U per day after she finishes the 50K q week dosing).  An After Visit Summary was printed and given to the patient.  FOLLOW UP:  Return in about 1 year (around 03/28/2017) for annual CPE with fasting labs the week prior.  Signed:  Santiago Bumpers, MD           03/28/2016

## 2016-03-28 NOTE — Progress Notes (Signed)
Pre visit review using our clinic review tool, if applicable. No additional management support is needed unless otherwise documented below in the visit note. 

## 2016-03-29 LAB — HEPATITIS C ANTIBODY: HCV Ab: NEGATIVE

## 2016-04-22 DIAGNOSIS — R69 Illness, unspecified: Secondary | ICD-10-CM | POA: Diagnosis not present

## 2016-04-22 DIAGNOSIS — H1789 Other corneal scars and opacities: Secondary | ICD-10-CM | POA: Diagnosis not present

## 2016-04-22 DIAGNOSIS — H40053 Ocular hypertension, bilateral: Secondary | ICD-10-CM | POA: Diagnosis not present

## 2016-04-22 DIAGNOSIS — H5213 Myopia, bilateral: Secondary | ICD-10-CM | POA: Diagnosis not present

## 2016-04-22 DIAGNOSIS — H52223 Regular astigmatism, bilateral: Secondary | ICD-10-CM | POA: Diagnosis not present

## 2016-05-06 ENCOUNTER — Ambulatory Visit: Payer: Medicare HMO | Admitting: Internal Medicine

## 2016-07-01 ENCOUNTER — Other Ambulatory Visit (INDEPENDENT_AMBULATORY_CARE_PROVIDER_SITE_OTHER): Payer: Medicare HMO

## 2016-07-01 DIAGNOSIS — E559 Vitamin D deficiency, unspecified: Secondary | ICD-10-CM | POA: Diagnosis not present

## 2016-07-01 LAB — VITAMIN D 25 HYDROXY (VIT D DEFICIENCY, FRACTURES): VITD: 55.55 ng/mL (ref 30.00–100.00)

## 2016-07-03 ENCOUNTER — Encounter: Payer: Self-pay | Admitting: Family Medicine

## 2016-12-02 ENCOUNTER — Encounter: Payer: Self-pay | Admitting: *Deleted

## 2017-03-23 ENCOUNTER — Telehealth: Payer: Self-pay | Admitting: Family Medicine

## 2017-03-23 ENCOUNTER — Telehealth: Payer: Self-pay

## 2017-03-23 DIAGNOSIS — E559 Vitamin D deficiency, unspecified: Secondary | ICD-10-CM

## 2017-03-23 DIAGNOSIS — M810 Age-related osteoporosis without current pathological fracture: Secondary | ICD-10-CM

## 2017-03-23 DIAGNOSIS — E2839 Other primary ovarian failure: Secondary | ICD-10-CM

## 2017-03-23 DIAGNOSIS — Z0000 Encounter for general adult medical examination without abnormal findings: Secondary | ICD-10-CM

## 2017-03-23 NOTE — Telephone Encounter (Signed)
Pls also check a 25-OH vitamin D level.-thx

## 2017-03-23 NOTE — Telephone Encounter (Signed)
Vitamin D added.

## 2017-03-23 NOTE — Telephone Encounter (Signed)
OK--ordered as per pt request.

## 2017-03-23 NOTE — Telephone Encounter (Signed)
Pt is calling and would like a order placed in to get a bone density at [facility]. Her last was 12/2015. She has been diagnosed with osteoporosis and wouldl ike to get that checked out. Please advise.

## 2017-03-23 NOTE — Telephone Encounter (Signed)
Detailed message left on voice mail. Okay per DPR. 

## 2017-03-23 NOTE — Telephone Encounter (Signed)
Added health panel, anything else desired?  Please advise.

## 2017-03-23 NOTE — Telephone Encounter (Signed)
Copied from CRM [id number]. Topic: Inquiry >> Mar 23, 2017 11:48 AM Windy KalataMichael, Dickie Cloe L, NT wrote: Pt requested lab work done prior to physical. Lab appt is schedule 04/25/17 (ok'd per [place]). Can you please put lab orders in.

## 2017-03-31 DIAGNOSIS — M810 Age-related osteoporosis without current pathological fracture: Secondary | ICD-10-CM | POA: Diagnosis not present

## 2017-03-31 DIAGNOSIS — Z1231 Encounter for screening mammogram for malignant neoplasm of breast: Secondary | ICD-10-CM | POA: Diagnosis not present

## 2017-03-31 LAB — HM DEXA SCAN

## 2017-04-06 ENCOUNTER — Encounter: Payer: Self-pay | Admitting: Family Medicine

## 2017-04-09 ENCOUNTER — Telehealth: Payer: Self-pay | Admitting: Family Medicine

## 2017-04-09 ENCOUNTER — Encounter: Payer: Self-pay | Admitting: Family Medicine

## 2017-04-09 NOTE — Telephone Encounter (Signed)
Pls notify pt that her bone density test was improved compared to 2 yrs ago, but her left hip still shows osteoporosis.  Pt has declined med for this in the past, but offer fosamax 1 pill per week as treatment in addition to her vit d and calcium.  Let me know. -thx

## 2017-04-10 NOTE — Telephone Encounter (Signed)
[facility] Please advise.

## 2017-04-10 NOTE — Telephone Encounter (Signed)
Left message for pt to call back  °

## 2017-04-11 NOTE — Telephone Encounter (Signed)
Noted  

## 2017-04-11 NOTE — Telephone Encounter (Signed)
Pt advised and voiced understanding. She requested a copy of the report be mailed to her. She stated that she will discuss this further at her f/u visit on 04/28/17. Copy of report mailed to pt.

## 2017-04-12 DIAGNOSIS — H52223 Regular astigmatism, bilateral: Secondary | ICD-10-CM | POA: Diagnosis not present

## 2017-04-18 DIAGNOSIS — Z01419 Encounter for gynecological examination (general) (routine) without abnormal findings: Secondary | ICD-10-CM | POA: Diagnosis not present

## 2017-04-18 DIAGNOSIS — Z124 Encounter for screening for malignant neoplasm of cervix: Secondary | ICD-10-CM | POA: Diagnosis not present

## 2017-04-24 ENCOUNTER — Other Ambulatory Visit: Payer: Self-pay | Admitting: Family Medicine

## 2017-04-24 ENCOUNTER — Telehealth: Payer: Self-pay | Admitting: *Deleted

## 2017-04-24 DIAGNOSIS — M1710 Unilateral primary osteoarthritis, unspecified knee: Secondary | ICD-10-CM

## 2017-04-24 NOTE — Telephone Encounter (Signed)
Copied from CRM [id number]. Topic: Inquiry >> Apr 24, 2017  9:29 AM Windy KalataMichael, Taylor L, NT wrote: Patient is coming in on 04/27/17 for lab work and is requesting to have a C reactive protein test as well. Please advise

## 2017-04-24 NOTE — Telephone Encounter (Signed)
Left message on cell vm stating that "Dr. Milinda CaveMcGowen has put in an order for the CRP to be done with her labs".

## 2017-04-24 NOTE — Telephone Encounter (Signed)
OK, CRP added on to her future labs.

## 2017-04-24 NOTE — Telephone Encounter (Signed)
SW pt, she would like to have the C reactive protein test added because she wants to rule out inflammation and she had heard that this could cause problems with bone health. Please advise. Thanks.

## 2017-04-25 ENCOUNTER — Other Ambulatory Visit: Payer: Medicare HMO

## 2017-04-27 ENCOUNTER — Other Ambulatory Visit: Payer: Medicare HMO

## 2017-04-28 ENCOUNTER — Other Ambulatory Visit (INDEPENDENT_AMBULATORY_CARE_PROVIDER_SITE_OTHER): Payer: Medicare HMO

## 2017-04-28 ENCOUNTER — Ambulatory Visit: Payer: Medicare HMO

## 2017-04-28 ENCOUNTER — Telehealth: Payer: Self-pay | Admitting: Family Medicine

## 2017-04-28 DIAGNOSIS — Z0000 Encounter for general adult medical examination without abnormal findings: Secondary | ICD-10-CM

## 2017-04-28 DIAGNOSIS — E559 Vitamin D deficiency, unspecified: Secondary | ICD-10-CM

## 2017-04-28 DIAGNOSIS — M1710 Unilateral primary osteoarthritis, unspecified knee: Secondary | ICD-10-CM | POA: Diagnosis not present

## 2017-04-28 LAB — CBC WITH DIFFERENTIAL/PLATELET
BASOS PCT: 1.2 % (ref 0.0–3.0)
Basophils Absolute: 0.1 10*3/uL (ref 0.0–0.1)
EOS PCT: 2.8 % (ref 0.0–5.0)
Eosinophils Absolute: 0.2 10*3/uL (ref 0.0–0.7)
HEMATOCRIT: 40.4 % (ref 36.0–46.0)
HEMOGLOBIN: 13.6 g/dL (ref 12.0–15.0)
Lymphocytes Relative: 35.8 % (ref 12.0–46.0)
Lymphs Abs: 2.9 10*3/uL (ref 0.7–4.0)
MCHC: 33.7 g/dL (ref 30.0–36.0)
MCV: 93.6 fl (ref 78.0–100.0)
MONO ABS: 0.7 10*3/uL (ref 0.1–1.0)
Monocytes Relative: 8.6 % (ref 3.0–12.0)
Neutro Abs: 4.1 10*3/uL (ref 1.4–7.7)
Neutrophils Relative %: 51.6 % (ref 43.0–77.0)
Platelets: 254 10*3/uL (ref 150.0–400.0)
RBC: 4.31 Mil/uL (ref 3.87–5.11)
RDW: 13.4 % (ref 11.5–15.5)
WBC: 8 10*3/uL (ref 4.0–10.5)

## 2017-04-28 LAB — COMPREHENSIVE METABOLIC PANEL
ALBUMIN: 4.1 g/dL (ref 3.5–5.2)
ALT: 10 U/L (ref 0–35)
AST: 13 U/L (ref 0–37)
Alkaline Phosphatase: 59 U/L (ref 39–117)
BUN: 16 mg/dL (ref 6–23)
CHLORIDE: 102 meq/L (ref 96–112)
CO2: 27 mEq/L (ref 19–32)
CREATININE: 0.77 mg/dL (ref 0.40–1.20)
Calcium: 10.2 mg/dL (ref 8.4–10.5)
GFR: 78.97 mL/min (ref >60.00)
Glucose, Bld: 81 mg/dL (ref 70–99)
POTASSIUM: 4.6 meq/L (ref 3.5–5.1)
Sodium: 137 mEq/L (ref 135–145)
Total Bilirubin: 0.8 mg/dL (ref 0.2–1.2)
Total Protein: 6.9 g/dL (ref 6.0–8.3)

## 2017-04-28 LAB — VITAMIN D 25 HYDROXY (VIT D DEFICIENCY, FRACTURES): VITD: 113.97 ng/mL (ref 30.00–100.00)

## 2017-04-28 LAB — LIPID PANEL
Cholesterol: 188 mg/dL (ref 0–200)
HDL: 55.9 mg/dL (ref >39.00)
LDL Cholesterol: 107 mg/dL — ABNORMAL HIGH (ref 0–99)
NonHDL: 132.46
Total CHOL/HDL Ratio: 3
Triglycerides: 129 mg/dL (ref 0.0–149.0)
VLDL: 25.8 mg/dL (ref 0.0–40.0)

## 2017-04-28 LAB — C-REACTIVE PROTEIN: CRP: 0.2 mg/dL — AB (ref 0.5–20.0)

## 2017-04-28 LAB — TSH: TSH: 2.16 u[IU]/mL (ref 0.35–4.50)

## 2017-04-28 NOTE — Telephone Encounter (Signed)
Nursing after hours line called with critical lab value: Vitamin D 113.9. Patient had labs collected prior to her upcoming appointment for CPE on Monday with her provider. Will forward to CPE to address on her upcoming appointment.

## 2017-04-29 NOTE — Telephone Encounter (Signed)
Noted  

## 2017-05-01 ENCOUNTER — Ambulatory Visit (INDEPENDENT_AMBULATORY_CARE_PROVIDER_SITE_OTHER): Payer: Medicare HMO | Admitting: Family Medicine

## 2017-05-01 ENCOUNTER — Encounter: Payer: Self-pay | Admitting: Family Medicine

## 2017-05-01 ENCOUNTER — Ambulatory Visit: Payer: Medicare HMO

## 2017-05-01 VITALS — BP 127/77 | HR 82 | Temp 97.9°F | Resp 16 | Ht 61.0 in | Wt 138.5 lb

## 2017-05-01 DIAGNOSIS — M810 Age-related osteoporosis without current pathological fracture: Secondary | ICD-10-CM | POA: Diagnosis not present

## 2017-05-01 DIAGNOSIS — E559 Vitamin D deficiency, unspecified: Secondary | ICD-10-CM | POA: Diagnosis not present

## 2017-05-01 DIAGNOSIS — Z23 Encounter for immunization: Secondary | ICD-10-CM | POA: Diagnosis not present

## 2017-05-01 DIAGNOSIS — Z0000 Encounter for general adult medical examination without abnormal findings: Secondary | ICD-10-CM | POA: Diagnosis not present

## 2017-05-01 MED ORDER — ZOSTER VAC RECOMB ADJUVANTED 50 MCG/0.5ML IM SUSR: 0.5000 mL | Freq: Once | INTRAMUSCULAR | 1 refills | Status: AC

## 2017-05-01 MED ORDER — ZOSTER VAC RECOMB ADJUVANTED 50 MCG/0.5ML IM SUSR: 0.5000 mL | Freq: Once | INTRAMUSCULAR | 1 refills | Status: DC

## 2017-05-01 NOTE — Addendum Note (Signed)
Addended by: Regan RakersAY, LAURA K on: 05/01/2017 03:02 PM   Modules accepted: Orders

## 2017-05-01 NOTE — Progress Notes (Signed)
Office Note 05/01/2017  CC:  Chief Complaint  Patient presents with  . Annual Exam    HPI:  Brooke Lee is a 67 y.o. White female who is here for annual health maintenance exam. She feels well.  Reviewed all recent fasting labs today--all normal except vit D level high.  Starting osteo-strong exercises to try to help bone density, which was recently retested and once again T score med criteria for osteoporosis. Taking vit D 5000 U bid and calcium supplement.   Past Medical History:  Diagnosis Date  . Abnormal CXR 04/22/2013   03/2013 CXR > very mild atelectasis vs scarring left lung .  Hx of pneumonia twice as a child.  Marland Kitchen. BAKER'S CYST, RIGHT KNEE 10/10/2006   Qualifier: Diagnosis of  By: Darrick PennaFIELDS MD, KARL    . Osteoarthritis of both knees    As of 2017 pt getting platelet rich plasma injections AND as of 2019 getting stem cell therapy injections (via [place] in [place]  . Osteoporosis 12/2014; 01/27/16   T-score -3.1 R femoral neck 12/2015, recommended bisphosphonate but pt declined.  02/2017 T-score -2.7 L femur neck.   . 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 Vitamin D deficiency 2018   Normalized s/p high dose vit D replacement---stay on chronic 5K U qd.    Past Surgical History:  Procedure Laterality Date  . ABDOMINOPLASTY  remote past  . BREAST BIOPSY  multiple   benign;   . COLONOSCOPY  approx 2011   normal; recall 10 yrs  . DEXA  2015; 01/27/16   12/2015 T-score -3.1  . KNEE ARTHROSCOPY  2015  . TMJ ARTHROSCOPY  remote past    Family History  Problem Relation Age of Onset  . Heart disease Mother   . Hypothyroidism Mother   . Heart disease Father   . Arthritis Father   . Hyperlipidemia Father   . Diabetes Father     Social History   Socioeconomic History  . Marital status: Married    Spouse name: Not on file  . Number of children: Not on file  . Years of education: Not on file  . Highest education level: Not on file  Social Needs   . Financial resource strain: Not on file  . Food insecurity - worry: Not on file  . Food insecurity - inability: Not on file  . Transportation needs - medical: Not on file  . Transportation needs - non-medical: Not on file  Occupational History  . Not on file  Tobacco Use  . Smoking status: Never Smoker  . Smokeless tobacco: Never Used  Substance and Sexual Activity  . Alcohol use: Yes    Alcohol/week: 1.8 - 3.0 oz    Types: 3 - 5 Glasses of wine per week    Comment: weekly  . Drug use: No  . Sexual activity: Not on file  Other Topics Concern  . Not on file  Social History Narrative   Married, two children, one grandchild.   Retired: Former Psychologist, sport and exercisebusiness owner, Conservation officer, historic buildingscopiers.   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   NO smoking.   Alc: avg 1 glass wine per night.   Exercise: wt training 3X per week, walking, yoga.             Outpatient Medications Prior to Visit  Medication Sig Dispense Refill  . CALCIUM ALGINATE EX Apply topically.    . Cholecalciferol (VITAMIN D3) 5000 units  TBDP Take by mouth 2 (two) times daily.    Marland Kitchen glucosamine-chondroitin 500-400 MG tablet Take 1 tablet by mouth 3 (three) times daily.    . Multiple Vitamin (MULTIVITAMIN) tablet Take 1 tablet by mouth daily.    Marland Kitchen acyclovir (ZOVIRAX) 200 MG capsule     . Vitamin D, Ergocalciferol, (DRISDOL) 50000 units CAPS capsule Take 1 capsule (50,000 Units total) by mouth every 7 (seven) days. (Patient not taking: Reported on 05/01/2017) 12 capsule 0   No facility-administered medications prior to visit.     Allergies  Allergen Reactions  . Amoxicillin-Pot Clavulanate Nausea And Vomiting  . Benadryl [Diphenhydramine Hcl]     Heart races    ROS Review of Systems  Constitutional: Negative for appetite change, chills, fatigue and fever.  HENT: Negative for congestion, dental problem, ear pain and sore throat.   Eyes: Negative for discharge, redness and visual disturbance.   Respiratory: Negative for cough, chest tightness, shortness of breath and wheezing.   Cardiovascular: Negative for chest pain, palpitations and leg swelling.  Gastrointestinal: Negative for abdominal pain, blood in stool, diarrhea, nausea and vomiting.  Genitourinary: Negative for difficulty urinating, dysuria, flank pain, frequency, hematuria and urgency.  Musculoskeletal: Positive for arthralgias (milc chronic bilat knee pain). Negative for back pain, joint swelling, myalgias and neck stiffness.  Skin: Negative for pallor and rash.  Neurological: Negative for dizziness, speech difficulty, weakness and headaches.  Hematological: Negative for adenopathy. Does not bruise/bleed easily.  Psychiatric/Behavioral: Negative for confusion and sleep disturbance. The patient is not nervous/anxious.     PE; Blood pressure 127/77, pulse 82, temperature 97.9 F (36.6 C), temperature source Oral, resp. rate 16, height 5\' 1"  (1.549 m), weight 138 lb 8 oz (62.8 kg), SpO2 96 %. Body mass index is 26.17 kg/m. Exam chaperoned by Vanetta Mulders, CMA.  Gen: Alert, well appearing.  Patient is oriented to person, place, time, and situation. AFFECT: pleasant, lucid thought and speech. ENT: Ears: EACs clear, normal epithelium.  TMs with good light reflex and landmarks bilaterally.  Eyes: no injection, icteris, swelling, or exudate.  EOMI, PERRLA. Nose: no drainage or turbinate edema/swelling.  No injection or focal lesion.  Mouth: lips without lesion/swelling.  Oral mucosa pink and moist.  Dentition intact and without obvious caries or gingival swelling.  Oropharynx without erythema, exudate, or swelling.  Neck: supple/nontender.  No LAD, mass, or TM.  Carotid pulses 2+ bilaterally, without bruits. CV: RRR, no m/r/g.   LUNGS: CTA bilat, nonlabored resps, good aeration in all lung fields. ABD: soft, NT, ND, BS normal.  No hepatospenomegaly or mass.  No bruits. EXT: no clubbing, cyanosis, or edema.  Musculoskeletal: no  joint swelling, erythema, warmth, or tenderness.  ROM of all joints intact. Skin - no sores or suspicious lesions or rashes or color changes   Pertinent labs:  Lab Results  Component Value Date   TSH 2.16 04/28/2017   Lab Results  Component Value Date   WBC 8.0 04/28/2017   HGB 13.6 04/28/2017   HCT 40.4 04/28/2017   MCV 93.6 04/28/2017   PLT 254.0 04/28/2017   Lab Results  Component Value Date   CREATININE 0.77 04/28/2017   BUN 16 04/28/2017   NA 137 04/28/2017   K 4.6 04/28/2017   CL 102 04/28/2017   CO2 27 04/28/2017   Lab Results  Component Value Date   ALT 10 04/28/2017   AST 13 04/28/2017   ALKPHOS 59 04/28/2017   BILITOT 0.8 04/28/2017   Lab Results  Component Value Date   CHOL 188 04/28/2017   Lab Results  Component Value Date   HDL 55.90 04/28/2017   Lab Results  Component Value Date   LDLCALC 107 (H) 04/28/2017   Lab Results  Component Value Date   TRIG 129.0 04/28/2017   Lab Results  Component Value Date   CHOLHDL 3 04/28/2017   Lab Results  Component Value Date   CRP 0.2 (L) 04/28/2017   Vit D level was 114 on 04/28/17.  ASSESSMENT AND PLAN:   Health maintenance exam: Reviewed age and gender appropriate health maintenance issues (prudent diet, regular exercise, health risks of tobacco and excessive alcohol, use of seatbelts, fire alarms in home, use of sunscreen).  Also reviewed age and gender appropriate health screening as well as vaccine recommendations. Vaccines:  Flu-- given today.  Shingrix-- gave rx for this today.  She had pneumovax 23 approx 6 yrs ago---before she was age 63 yrs---no booster needed. Labs: reviewed recent fasting HP labs + vit D in detail with pt today. Cervical ca screening: Most recent pap through her GYN was 03/2017. Breast ca screening: last mammo 02/2017.  Due for repeat screening mammography 02/2018. DEXA: 02/2017 T score -2.7 (improved compared to 2017).  Recommended bisphosphonate again--but she chooses to  decline again.  She is adding Osteo-strong exercises and feels like she is already seeing a benefit. Hx of vit D deficiency: vit D level 114 with recent labs.  Decrease otc vit D supplement to 5000 IU qd instead of BID. Recheck vit D level in 2 mo. Colon ca screening: next screening colonoscopy due 2021.  An After Visit Summary was printed and given to the patient.  FOLLOW UP:  Return for annual CPE (fasting).  Also, lab visit for vit D recheck  in 2 mo..  Signed:  Santiago Bumpers, MD           05/01/2017

## 2017-05-01 NOTE — Patient Instructions (Signed)

## 2017-05-02 DIAGNOSIS — L821 Other seborrheic keratosis: Secondary | ICD-10-CM | POA: Diagnosis not present

## 2017-05-02 DIAGNOSIS — L565 Disseminated superficial actinic porokeratosis (DSAP): Secondary | ICD-10-CM | POA: Diagnosis not present

## 2017-05-02 DIAGNOSIS — B0089 Other herpesviral infection: Secondary | ICD-10-CM | POA: Diagnosis not present

## 2017-12-12 ENCOUNTER — Telehealth: Payer: Self-pay | Admitting: Family Medicine

## 2017-12-12 MED ORDER — SCOPOLAMINE 1 MG/3DAYS TD PT72: 1.0000 | MEDICATED_PATCH | TRANSDERMAL | 1 refills | Status: AC

## 2017-12-12 MED ORDER — ACETAZOLAMIDE 125 MG PO TABS: ORAL_TABLET | ORAL | 0 refills | Status: AC

## 2017-12-12 NOTE — Telephone Encounter (Signed)
Both meds eRx'd.

## 2017-12-12 NOTE — Telephone Encounter (Signed)
Copied from CRM [id number]. Topic: General - Other >> Dec 12, 2017 11:05 AM Stephannie Li, NT wrote: Reason for CRM: Patient is traveling to [place] in November and is requesting a prescription for motion sickness and also altitude sickness sent to [id number] [place], [place] - [address] [phone] (Phone) [phone] (Fax) please call her at [phone] with any questions

## 2017-12-12 NOTE — Telephone Encounter (Signed)
Left detailed message of RX's at pharmacy on patient's phone.  Okay per DPR.

## 2018-01-17 ENCOUNTER — Encounter: Payer: Self-pay | Admitting: *Deleted

## 2018-02-08 DIAGNOSIS — H25813 Combined forms of age-related cataract, bilateral: Secondary | ICD-10-CM | POA: Diagnosis not present

## 2018-02-08 DIAGNOSIS — H1789 Other corneal scars and opacities: Secondary | ICD-10-CM | POA: Diagnosis not present

## 2018-02-10 DIAGNOSIS — R05 Cough: Secondary | ICD-10-CM | POA: Diagnosis not present

## 2018-02-10 DIAGNOSIS — J00 Acute nasopharyngitis [common cold]: Secondary | ICD-10-CM | POA: Diagnosis not present

## 2018-04-09 DIAGNOSIS — Z1231 Encounter for screening mammogram for malignant neoplasm of breast: Secondary | ICD-10-CM | POA: Diagnosis not present

## 2018-04-09 LAB — HM MAMMOGRAPHY

## 2018-04-11 ENCOUNTER — Encounter: Payer: Self-pay | Admitting: Family Medicine

## 2018-05-07 DIAGNOSIS — L821 Other seborrheic keratosis: Secondary | ICD-10-CM | POA: Diagnosis not present

## 2018-05-07 DIAGNOSIS — L565 Disseminated superficial actinic porokeratosis (DSAP): Secondary | ICD-10-CM | POA: Diagnosis not present

## 2018-07-09 DIAGNOSIS — Z124 Encounter for screening for malignant neoplasm of cervix: Secondary | ICD-10-CM | POA: Diagnosis not present

## 2019-03-27 ENCOUNTER — Other Ambulatory Visit: Payer: Medicare HMO

## 2021-08-25 LAB — COLOGUARD: COLOGUARD: NEGATIVE

## 2024-01-15 ENCOUNTER — Other Ambulatory Visit: Payer: Self-pay | Admitting: Family Medicine

## 2024-01-15 DIAGNOSIS — N644 Mastodynia: Secondary | ICD-10-CM

## 2024-01-17 ENCOUNTER — Inpatient Hospital Stay: Admission: RE | Admit: 2024-01-17 | Payer: Self-pay | Source: Ambulatory Visit

## 2024-01-17 ENCOUNTER — Other Ambulatory Visit
